# Patient Record
Sex: Male | Born: 1980 | Race: White | Hispanic: No | Marital: Single
Health system: Southern US, Community
[De-identification: ages and names within clinical notes are randomized; demographics above are authoritative.]

---

## 2015-03-06 ENCOUNTER — Emergency Department
Admit: 2015-03-06 | Disposition: A | Discharge status: Home / Self Care | Payer: Self-pay | Admitting: Emergency Medicine

## 2015-03-06 LAB — COMPREHENSIVE METABOLIC PANEL
ALT: 27 U/L
AST: 30 U/L
Albumin: 4.5 g/dL
Alkaline Phosphatase: 56 U/L
Anion Gap: 7 (ref 7–16)
BILIRUBIN TOTAL: 0.4 mg/dL
BUN: 21 mg/dL — AB
CALCIUM: 9.3 mg/dL
CHLORIDE: 107 mmol/L
CO2: 26 mmol/L
Creatinine: 1.13 mg/dL
EGFR (African American): >60
EGFR (Non-African Amer.): >60
Glucose: 94 mg/dL
POTASSIUM: 4.1 mmol/L
Sodium: 140 mmol/L
Total Protein: 7.8 g/dL

## 2015-03-06 LAB — CBC
HCT: 41.6 % (ref 40.0–52.0)
HGB: 13.4 g/dL (ref 13.0–18.0)
MCH: 27.1 pg (ref 26.0–34.0)
MCHC: 32.1 g/dL (ref 32.0–36.0)
MCV: 84 fL (ref 80–100)
Platelet: 255 10*3/uL (ref 150–440)
RBC: 4.94 10*6/uL (ref 4.40–5.90)
RDW: 14.8 % — ABNORMAL HIGH (ref 11.5–14.5)
WBC: 8.1 10*3/uL (ref 3.8–10.6)

## 2015-03-06 LAB — ACETAMINOPHEN LEVEL: Acetaminophen: <10 ug/mL

## 2015-03-06 LAB — ETHANOL: Ethanol: <5 mg/dL

## 2015-03-06 LAB — SALICYLATE LEVEL: Salicylates, Serum: <4 mg/dL

## 2015-03-07 LAB — DRUG SCREEN, URINE
Amphetamines, Ur Screen: POSITIVE
BARBITURATES, UR SCREEN: NEGATIVE
Benzodiazepine, Ur Scrn: NEGATIVE
CANNABINOID 50 NG, UR ~~LOC~~: NEGATIVE
COCAINE METABOLITE, UR ~~LOC~~: POSITIVE
MDMA (Ecstasy)Ur Screen: NEGATIVE
Methadone, Ur Screen: NEGATIVE
Opiate, Ur Screen: NEGATIVE
Phencyclidine (PCP) Ur S: NEGATIVE
Tricyclic, Ur Screen: NEGATIVE

## 2015-03-07 LAB — URINALYSIS, COMPLETE
Bilirubin,UR: NEGATIVE
Blood: NEGATIVE
GLUCOSE, UR: NEGATIVE mg/dL (ref 0–75)
LEUKOCYTE ESTERASE: NEGATIVE
Nitrite: NEGATIVE
Ph: 7 (ref 4.5–8.0)
Protein: NEGATIVE
RBC,UR: <1 /HPF (ref 0–5)
Specific Gravity: 1.014 (ref 1.003–1.030)
Squamous Epithelial: NONE SEEN
WBC UR: 3 /HPF (ref 0–5)

## 2015-04-02 NOTE — Consult Note (Addendum)
PATIENT NAME:  Jared Kramer, Jared Kramer DATE OF BIRTH:  14-Aug-1981  DATE OF CONSULTATION:  03/07/2015  REFERRING PHYSICIAN:   CONSULTING PHYSICIAN:  Audery AmelJohn T. Clapacs, MD  IDENTIFYING INFORMATION AND CHIEF COMPLAINT: This is a 34 year old man who comes to the Emergency Room with a chief complaint "I need help with detox."   HISTORY OF PRESENT ILLNESS: Information from the patient and the chart. The patient presented to the Emergency Room yesterday with a recent laceration to his right forearm and statements that he needed help with detoxification. He said that he has been using cocaine, benzodiazepines, and opiates heavily and has been doing so on and off for years. He cannot remember the last time he was sober. His last cocaine use was a couple of days ago, but prior to that he had been using daily for weeks. Benzodiazepines are about 3-4 bars of Xanax a day last taken 2 days ago, and opiates are whatever he can get his hands on including Opanas, Percocets, OxyContin, and heroin, last taken by his estimate 4-5 days ago. Also drinks regularly, cannot estimate the total amount, says he has not had any in 7 days. He wants to get detoxed because his girlfriend has thrown him out of the house and he lost his job about a week ago because of his substance abuse. His mood has been frustrated. His sleep has been a little erratic. Appetite normal. The patient denies any suicidal ideation. He cut his right forearm intentionally after being turned away [facility]. Based on his report it sounds like he had gone there and then walked out against medical advice and then tried to re-present [facility]. When they sent him back to the detoxification facility he was refused admission. At that point he cut his arm and called mobile crisis. He already went to [facility] and got the arm stapled and they did not think it  required hospitalization.   PAST PSYCHIATRIC HISTORY: The patient says he has been treated for depression just recently when they started him on lithium and trazodone and Zoloft at [facility] in [place]. Has never been in a psychiatric hospital. Denies any history of suicide attempts.   SUBSTANCE ABUSE HISTORY: He said he has had a drug and alcohol problem "my whole life." He has been detoxed on his own before. Has been able to have greater than a year of sobriety in the past before. It sounds like he may have gone to [facility] in the past as well. Has not been involved in any treatment program however in a couple of years at least. No history of seizures or DTs with alcohol withdrawal.   PAST MEDICAL HISTORY: Laceration to the right forearm which has been stapled shut. No other ongoing medical problems.   SOCIAL HISTORY: The patient had been married and then he and his wife got divorced and then they got back together again. Then she threw him out of the house again and so now it is unclear where he is going to stay. He had been working. 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 Has no children.   FAMILY HISTORY: Denies any family history of mental illness or substance abuse.   CURRENT MEDICATIONS: The patient says that he was started on lithium and Zoloft and that he normally takes a high blood pressure medicine but he does not know any  of them and we do not have any documentation of it in the system.   REVIEW OF SYSTEMS: Feeling achy and tired. Mildly jittery. A little sick to his stomach, but no major nausea. No vomiting. Denies suicidal or homicidal ideation. Denies hallucinations. Has sore in his right forearm as would be expected. No other physical complaints.   MENTAL STATUS EXAMINATION: Reasonably well groomed man who looks his stated age. Passively cooperative with the interview. Eye contact only occasional. Psychomotor activity sluggish. Speech  decreased in total amount, but easy to understand. Affect euthymic. Mood stated as all right. Thoughts are lucid without loosening of associations or delusions. Denies auditory or visual hallucinations. Denies any suicidal or homicidal ideation. The patient is alert and oriented x 4. Can repeat 3 words immediately, remembers only 1 out of 3 at 3 minutes. Judgment and insight impaired particularly by substance abuse. Normal fund of knowledge at baseline.   LABORATORY RESULTS: Alcohol level on presentation negative. Chemistry panel all normal except for a very slightly abnormal BUN of no significance. CBC is all normal. Urinalysis, 3 + bacteria, but minimal white cells. Drug screen finally came back and is positive for amphetamines, which is the only thing he did not tell me that he abused as well as cocaine. No sign of opiates or benzodiazepines. Salicylates negative.   VITAL SIGNS: Blood pressure 112/78, respirations 18, pulse 74, temperature 98.   ASSESSMENT: A 34 year old man who is reporting polysubstance abuse, although it sounds like it has been several days since he had most of it. He is dysphoric, but not suicidal and not psychotic. He cut his arm out of frustration without any apparent suicidal intent. No evidence of being psychotic right now. Does seem desperate to get substance abuse treatment.  TREATMENT PLAN: I suggested that we refer him to the alcohol and drug abuse treatment center. We can make a referral and see if we can possibly get him there within the next couple days. P.r.n. detoxification medicine meanwhile if needed. Monitor vital signs. No clear indication to restart those medicines unless his blood pressure goes up.   DIAGNOSIS PRINCIPAL AND PRIMARY:   AXIS I:  1.  Cocaine dependence.  2.  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  3.  Opiate abuse, severe.  4.  Alcohol abuse, moderate to severe.   AXIS II: Deferred.   AXIS III:  1.  Laceration to forearm, no  further treatment required.  2.  History of hypertension.     ____________________________ Audery Amel, MD jtc:bu D: 03/07/2015 13:20:18 ET T: 03/07/2015 13:34:25 ET JOB#: [id number]  cc: Audery Amel, MD, <Dictator> Audery Amel MD ELECTRONICALLY SIGNED 04/07/2015 10:06

## 2019-10-19 HISTORY — PX: FOREIGN BODY REMOVAL: SHX962

## 2020-03-02 ENCOUNTER — Emergency Department: Payer: Medicaid Other

## 2020-03-02 ENCOUNTER — Inpatient Hospital Stay
Admission: EM | Admit: 2020-03-02 | Discharge: 2020-03-05 | DRG: 917 | Disposition: A | Discharge status: Other Acute Inpatient Hospital | Payer: Medicaid Other | Attending: Pulmonary Disease | Admitting: Pulmonary Disease

## 2020-03-02 ENCOUNTER — Other Ambulatory Visit: Payer: Self-pay

## 2020-03-02 DIAGNOSIS — E162 Hypoglycemia, unspecified: Secondary | ICD-10-CM | POA: Diagnosis not present

## 2020-03-02 DIAGNOSIS — M795 Residual foreign body in soft tissue: Secondary | ICD-10-CM

## 2020-03-02 DIAGNOSIS — J690 Pneumonitis due to inhalation of food and vomit: Secondary | ICD-10-CM

## 2020-03-02 DIAGNOSIS — F209 Schizophrenia, unspecified: Secondary | ICD-10-CM | POA: Diagnosis present

## 2020-03-02 DIAGNOSIS — T50901A Poisoning by unspecified drugs, medicaments and biological substances, accidental (unintentional), initial encounter: Secondary | ICD-10-CM | POA: Diagnosis present

## 2020-03-02 DIAGNOSIS — F1110 Opioid abuse, uncomplicated: Secondary | ICD-10-CM | POA: Diagnosis present

## 2020-03-02 DIAGNOSIS — Z888 Allergy status to other drugs, medicaments and biological substances status: Secondary | ICD-10-CM

## 2020-03-02 DIAGNOSIS — T50911A Poisoning by multiple unspecified drugs, medicaments and biological substances, accidental (unintentional), initial encounter: Principal | ICD-10-CM | POA: Diagnosis present

## 2020-03-02 DIAGNOSIS — Z20822 Contact with and (suspected) exposure to covid-19: Secondary | ICD-10-CM | POA: Diagnosis present

## 2020-03-02 DIAGNOSIS — Z4659 Encounter for fitting and adjustment of other gastrointestinal appliance and device: Secondary | ICD-10-CM

## 2020-03-02 DIAGNOSIS — Z452 Encounter for adjustment and management of vascular access device: Secondary | ICD-10-CM

## 2020-03-02 DIAGNOSIS — Z7151 Drug abuse counseling and surveillance of drug abuser: Secondary | ICD-10-CM

## 2020-03-02 DIAGNOSIS — F6089 Other specific personality disorders: Secondary | ICD-10-CM | POA: Diagnosis present

## 2020-03-02 DIAGNOSIS — Z7289 Other problems related to lifestyle: Secondary | ICD-10-CM

## 2020-03-02 DIAGNOSIS — N1330 Unspecified hydronephrosis: Secondary | ICD-10-CM | POA: Diagnosis present

## 2020-03-02 DIAGNOSIS — Z915 Personal history of self-harm: Secondary | ICD-10-CM

## 2020-03-02 DIAGNOSIS — R339 Retention of urine, unspecified: Secondary | ICD-10-CM

## 2020-03-02 DIAGNOSIS — J9600 Acute respiratory failure, unspecified whether with hypoxia or hypercapnia: Secondary | ICD-10-CM

## 2020-03-02 DIAGNOSIS — F319 Bipolar disorder, unspecified: Secondary | ICD-10-CM | POA: Diagnosis present

## 2020-03-02 DIAGNOSIS — T50904A Poisoning by unspecified drugs, medicaments and biological substances, undetermined, initial encounter: Secondary | ICD-10-CM

## 2020-03-02 DIAGNOSIS — N3289 Other specified disorders of bladder: Secondary | ICD-10-CM | POA: Diagnosis present

## 2020-03-02 DIAGNOSIS — F1320 Sedative, hypnotic or anxiolytic dependence, uncomplicated: Secondary | ICD-10-CM | POA: Diagnosis present

## 2020-03-02 DIAGNOSIS — G92 Toxic encephalopathy: Secondary | ICD-10-CM | POA: Diagnosis present

## 2020-03-02 DIAGNOSIS — J9601 Acute respiratory failure with hypoxia: Secondary | ICD-10-CM | POA: Diagnosis present

## 2020-03-02 DIAGNOSIS — R569 Unspecified convulsions: Secondary | ICD-10-CM

## 2020-03-02 DIAGNOSIS — Z886 Allergy status to analgesic agent status: Secondary | ICD-10-CM

## 2020-03-02 DIAGNOSIS — B1920 Unspecified viral hepatitis C without hepatic coma: Secondary | ICD-10-CM | POA: Diagnosis present

## 2020-03-02 DIAGNOSIS — R0902 Hypoxemia: Secondary | ICD-10-CM

## 2020-03-02 DIAGNOSIS — I10 Essential (primary) hypertension: Secondary | ICD-10-CM | POA: Diagnosis present

## 2020-03-02 DIAGNOSIS — Z881 Allergy status to other antibiotic agents status: Secondary | ICD-10-CM

## 2020-03-02 DIAGNOSIS — G40409 Other generalized epilepsy and epileptic syndromes, not intractable, without status epilepticus: Secondary | ICD-10-CM | POA: Diagnosis not present

## 2020-03-02 LAB — URINALYSIS, COMPLETE (UACMP) WITH MICROSCOPIC
Bilirubin Urine: NEGATIVE
Glucose, UA: >=500 mg/dL — AB
Hgb urine dipstick: NEGATIVE
Ketones, ur: NEGATIVE mg/dL
Leukocytes,Ua: NEGATIVE
Nitrite: NEGATIVE
Protein, ur: NEGATIVE mg/dL
Specific Gravity, Urine: 1.005 (ref 1.005–1.030)
Squamous Epithelial / HPF: NONE SEEN (ref 0–5)
pH: 5 (ref 5.0–8.0)

## 2020-03-02 LAB — CBC WITH DIFFERENTIAL/PLATELET
Abs Immature Granulocytes: 0.02 10*3/uL (ref 0.00–0.07)
Basophils Absolute: 0 10*3/uL (ref 0.0–0.1)
Basophils Relative: 1 %
Eosinophils Absolute: 0.1 10*3/uL (ref 0.0–0.5)
Eosinophils Relative: 1 %
HCT: 44.9 % (ref 39.0–52.0)
Hemoglobin: 14.3 g/dL (ref 13.0–17.0)
Immature Granulocytes: 0 %
Lymphocytes Relative: 18 %
Lymphs Abs: 1.5 10*3/uL (ref 0.7–4.0)
MCH: 27.7 pg (ref 26.0–34.0)
MCHC: 31.8 g/dL (ref 30.0–36.0)
MCV: 87 fL (ref 80.0–100.0)
Monocytes Absolute: 0.6 10*3/uL (ref 0.1–1.0)
Monocytes Relative: 7 %
Neutro Abs: 6.1 10*3/uL (ref 1.7–7.7)
Neutrophils Relative %: 73 %
Platelets: 206 10*3/uL (ref 150–400)
RBC: 5.16 MIL/uL (ref 4.22–5.81)
RDW: 14.4 % (ref 11.5–15.5)
WBC: 8.4 10*3/uL (ref 4.0–10.5)
nRBC: 0 % (ref 0.0–0.2)

## 2020-03-02 LAB — BLOOD GAS, VENOUS
Acid-base deficit: 1.1 mmol/L (ref 0.0–2.0)
Bicarbonate: 24.3 mmol/L (ref 20.0–28.0)
O2 Saturation: 84.5 %
Patient temperature: 37
pCO2, Ven: 42 mmHg — ABNORMAL LOW (ref 44.0–60.0)
pH, Ven: 7.37 (ref 7.250–7.430)
pO2, Ven: 51 mmHg — ABNORMAL HIGH (ref 32.0–45.0)

## 2020-03-02 LAB — BASIC METABOLIC PANEL
Anion gap: 11 (ref 5–15)
BUN: 13 mg/dL (ref 6–20)
CO2: 21 mmol/L — ABNORMAL LOW (ref 22–32)
Calcium: 9.2 mg/dL (ref 8.9–10.3)
Chloride: 105 mmol/L (ref 98–111)
Creatinine, Ser: 1.02 mg/dL (ref 0.61–1.24)
GFR calc Af Amer: >60 mL/min (ref >60)
GFR calc non Af Amer: >60 mL/min (ref >60)
Glucose, Bld: 104 mg/dL — ABNORMAL HIGH (ref 70–99)
Potassium: 3.9 mmol/L (ref 3.5–5.1)
Sodium: 137 mmol/L (ref 135–145)

## 2020-03-02 LAB — URINE DRUG SCREEN, QUALITATIVE (ARMC ONLY)
Amphetamines, Ur Screen: NOT DETECTED
Barbiturates, Ur Screen: NOT DETECTED
Benzodiazepine, Ur Scrn: NOT DETECTED
Cannabinoid 50 Ng, Ur ~~LOC~~: NOT DETECTED
Cocaine Metabolite,Ur ~~LOC~~: POSITIVE — AB
MDMA (Ecstasy)Ur Screen: NOT DETECTED
Methadone Scn, Ur: NOT DETECTED
Opiate, Ur Screen: NOT DETECTED
Phencyclidine (PCP) Ur S: NOT DETECTED
Tricyclic, Ur Screen: POSITIVE — AB

## 2020-03-02 LAB — TROPONIN I (HIGH SENSITIVITY): Troponin I (High Sensitivity): 15 ng/L (ref <18)

## 2020-03-02 MED ORDER — SUCCINYLCHOLINE CHLORIDE 20 MG/ML IJ SOLN
INTRAMUSCULAR | Status: AC | PRN
  Administered 2020-03-02: 100 mg via INTRAVENOUS

## 2020-03-02 MED ORDER — PROPOFOL 1000 MG/100ML IV EMUL
INTRAVENOUS | Status: AC
  Administered 2020-03-02: 5 ug/kg/min via INTRAVENOUS
  Filled 2020-03-02: qty 100

## 2020-03-02 MED ORDER — NALOXONE HCL 2 MG/2ML IJ SOSY
2.0000 mg | PREFILLED_SYRINGE | Freq: Once | INTRAMUSCULAR | Status: AC
  Administered 2020-03-02: 2 mg via INTRAVENOUS

## 2020-03-02 MED ORDER — ETOMIDATE 2 MG/ML IV SOLN
INTRAVENOUS | Status: AC | PRN
  Administered 2020-03-02: 20 mg via INTRAVENOUS

## 2020-03-02 MED ORDER — SODIUM CHLORIDE 0.9 % IV SOLN: Freq: Once | INTRAVENOUS | Status: AC

## 2020-03-02 MED ORDER — PROPOFOL 1000 MG/100ML IV EMUL
5.0000 ug/kg/min | INTRAVENOUS | Status: DC
  Administered 2020-03-03: 30 ug/kg/min via INTRAVENOUS
  Administered 2020-03-03: 25 ug/kg/min via INTRAVENOUS
  Administered 2020-03-03: 30 ug/kg/min via INTRAVENOUS
  Administered 2020-03-03 – 2020-03-04 (×3): 25 ug/kg/min via INTRAVENOUS
  Administered 2020-03-04: 10 ug/kg/min via INTRAVENOUS
  Administered 2020-03-05: 30 ug/kg/min via INTRAVENOUS
  Administered 2020-03-05 (×4): 50 ug/kg/min via INTRAVENOUS
  Filled 2020-03-02 (×13): qty 100

## 2020-03-02 MED ORDER — FLUMAZENIL 0.5 MG/5ML IV SOLN
0.2000 mg | Freq: Once | INTRAVENOUS | Status: AC
  Administered 2020-03-02: 0.2 mg via INTRAVENOUS
  Filled 2020-03-02: qty 5

## 2020-03-02 NOTE — ED Provider Notes (Addendum)
[facility] Emergency Department Provider Note       Time seen: ----------------------------------------- 10:52 PM on 03/02/2020 -----------------------------------------  Level V caveat: History/ROS limited by altered mental status I have reviewed the triage vital signs and the nursing notes.  HISTORY   Chief Complaint Drug Overdose    HPI Jared Kramer is a 39 y.o. male with no known past medical history who presents to the ED for possible overdose.  He was found unresponsive except for painful stimuli in a gas station bathroom.  There was an empty pill bottle next to him.  History reviewed. No pertinent past medical history.  There are no problems to display for this patient.   History reviewed. No pertinent surgical history.  Allergies Patient has no allergy information on record.  Social History Social History   Tobacco Use  . Smoking status: Not on file  Substance Use Topics  . Alcohol use: Yes  . Drug use: Yes   Review of Systems Unknown, patient is responsive to pain  All systems negative/normal/unremarkable except as stated in the HPI  ____________________________________________   PHYSICAL EXAM:  VITAL SIGNS: ED Triage Vitals  Enc Vitals Group     BP --      Pulse Rate 03/02/20 2245 91     Resp 03/02/20 2245 12     Temp --      Temp src --      SpO2 03/02/20 2245 100 %     Weight 03/02/20 2239 250 lb (113.4 kg)     Height 03/02/20 2239 6' (1.829 m)     Head Circumference --      Peak Flow --      Pain Score 03/02/20 2239 Asleep     Pain Loc --      Pain Edu? --      Excl. in GC? --     Constitutional: Patient is not alert and is somewhat combative, no obvious distress Eyes: Disconjugate gaze, conjunctival injection ENT      Head: Normocephalic and atraumatic.      Nose: No congestion/rhinnorhea.      Mouth/Throat: Mucous membranes are moist.      Neck: No stridor. Cardiovascular: Normal rate, regular  rhythm. No murmurs, rubs, or gallops. Respiratory: Normal respiratory effort without tachypnea nor retractions. Breath sounds are clear and equal bilaterally. No wheezes/rales/rhonchi. Gastrointestinal: Soft, normal bowel sounds Musculoskeletal:  No lower extremity tenderness nor edema. Neurologic: GCS is 9, opens his eyes to pain, make incomprehensible sounds and localizes to pain Skin:  Skin is warm, dry and intact. No rash noted. Psychiatric: Mood and affect are normal. Speech and behavior are normal.  ____________________________________________  EKG: Interpreted by me.  Sinus rhythm with rate of 97 bpm, borderline right axis deviation, nonspecific ST segment changes, possible pericarditis  ____________________________________________  ED COURSE:  As part of my medical decision making, I reviewed the following data within the electronic MEDICAL RECORD NUMBER History obtained from family if available, nursing notes, old chart and ekg, as well as notes from prior ED visits. Patient presented for altered mental status and possible overdose, we will assess with labs and imaging as indicated at this time.   Procedure Name: Intubation Date/Time: 03/02/2020 11:29 PM Performed by: Emily Filbert, MD Pre-anesthesia Checklist: Patient identified, Patient being monitored, Emergency Drugs available, Timeout performed and Suction available Oxygen Delivery Method: Non-rebreather mask Preoxygenation: Pre-oxygenation with 100% oxygen Induction Type: Rapid sequence Ventilation: Mask ventilation without difficulty Laryngoscope Size: 3 Grade  View: Grade II Tube size: 8.0 mm Number of attempts: 1 Airway Equipment and Method: Video-laryngoscopy Placement Confirmation: ETT inserted through vocal cords under direct vision,  CO2 detector and Breath sounds checked- equal and bilateral Secured at: 24 cm Tube secured with: ETT holder Dental Injury: Teeth and Oropharynx as per pre-operative assessment   Difficulty Due To: Difficulty was unanticipated    OG placement  Date/Time: 03/02/2020 11:30 PM Performed by: Earleen Newport, MD Authorized by: Earleen Newport, MD  Consent: The procedure was performed in an emergent situation.  Sedation: Patient sedated: yes      Jared Kramer was evaluated in Emergency Department on 03/02/2020 for the symptoms described in the history of present illness. He was evaluated in the context of the global COVID-19 pandemic, which necessitated consideration that the patient might be at risk for infection with the SARS-CoV-2 virus that causes COVID-19. Institutional protocols and algorithms that pertain to the evaluation of patients at risk for COVID-19 are in a state of rapid change based on information released by regulatory bodies including the CDC and federal and state organizations. These policies and algorithms were followed during the patient's care in the ED.  ____________________________________________   LABS (pertinent positives/negatives)  Labs Reviewed  BASIC METABOLIC PANEL - Abnormal; Notable for the following components:      Result Value   CO2 21 (*)    Glucose, Bld 104 (*)    All other components within normal limits  BLOOD GAS, VENOUS - Abnormal; Notable for the following components:   pCO2, Ven 42 (*)    pO2, Ven 51.0 (*)    All other components within normal limits  CBC WITH DIFFERENTIAL/PLATELET  ETHANOL  URINE DRUG SCREEN, QUALITATIVE ([facility] ONLY)  URINALYSIS, COMPLETE ([facility]) WITH MICROSCOPIC  TROPONIN I (HIGH SENSITIVITY)   CRITICAL CARE Performed by: Laurence Aly   Total critical care time: 30 minutes  Critical care time was exclusive of separately billable procedures and treating other patients.  Critical care was necessary to treat or prevent imminent or life-threatening deterioration.  Critical care was time spent personally by me on the following activities: development of treatment plan with  patient and/or surrogate as well as nursing, discussions with consultants, evaluation of patient's response to treatment, examination of patient, obtaining history from patient or surrogate, ordering and performing treatments and interventions, ordering and review of laboratory studies, ordering and review of radiographic studies, pulse oximetry and re-evaluation of patient's condition.  RADIOLOGY Images were viewed by me  Chest x-ray and abdomen 1 view are pending at this time ET tube is in good position, OG tube is in the stomach CT head is pending at this time ____________________________________________   DIFFERENTIAL DIAGNOSIS   Overdose, intoxication, intracranial hemorrhage, CVA, sepsis  FINAL ASSESSMENT AND PLAN  Altered mental status, likely drug overdose   Plan: The patient had presented for altered mental status. Patient's labs so far have been unrevealing. Patient's imaging are still pending.  Patient arrived altered with a Glascow coma score around 8 but appear to be having periods of apnea.  He was intubated for airway protection and OG tube was placed also by me.  He will also be placed under involuntary commitment.  I have discussed with the ICU provider for admission.   Laurence Aly, MD    Note: This note was generated in part or whole with voice recognition software. Voice recognition is usually quite accurate but there are transcription errors that can and very often  do occur. I apologize for any typographical errors that were not detected and corrected.     Emily Filbert, MD 03/02/20 [id number]    Emily Filbert, MD 03/02/20 [phone]

## 2020-03-02 NOTE — ED Triage Notes (Signed)
Pt to ED via EMS. Pt arrives with possible overdose, pt was found repsonsive to painful stimuli only in gas station bathroom. There was an empty pill bottle found next to pt, white powder substance in bottle as well as snorting device and piece of glass that appears to belong to meth pipe.

## 2020-03-03 ENCOUNTER — Inpatient Hospital Stay (HOSPITAL_COMMUNITY)
Admit: 2020-03-03 | Discharge: 2020-03-03 | Disposition: A | Discharge status: Home / Self Care | Payer: Medicaid Other | Attending: Pulmonary Disease | Admitting: Pulmonary Disease

## 2020-03-03 ENCOUNTER — Emergency Department: Payer: Medicaid Other

## 2020-03-03 DIAGNOSIS — G92 Toxic encephalopathy: Secondary | ICD-10-CM | POA: Diagnosis present

## 2020-03-03 DIAGNOSIS — J9601 Acute respiratory failure with hypoxia: Secondary | ICD-10-CM

## 2020-03-03 DIAGNOSIS — T50904A Poisoning by unspecified drugs, medicaments and biological substances, undetermined, initial encounter: Secondary | ICD-10-CM

## 2020-03-03 DIAGNOSIS — Z881 Allergy status to other antibiotic agents status: Secondary | ICD-10-CM | POA: Diagnosis not present

## 2020-03-03 DIAGNOSIS — I10 Essential (primary) hypertension: Secondary | ICD-10-CM | POA: Diagnosis present

## 2020-03-03 DIAGNOSIS — J9602 Acute respiratory failure with hypercapnia: Secondary | ICD-10-CM | POA: Diagnosis not present

## 2020-03-03 DIAGNOSIS — F6089 Other specific personality disorders: Secondary | ICD-10-CM | POA: Diagnosis present

## 2020-03-03 DIAGNOSIS — F1320 Sedative, hypnotic or anxiolytic dependence, uncomplicated: Secondary | ICD-10-CM | POA: Diagnosis present

## 2020-03-03 DIAGNOSIS — R9431 Abnormal electrocardiogram [ECG] [EKG]: Secondary | ICD-10-CM | POA: Diagnosis not present

## 2020-03-03 DIAGNOSIS — T50904S Poisoning by unspecified drugs, medicaments and biological substances, undetermined, sequela: Secondary | ICD-10-CM | POA: Diagnosis not present

## 2020-03-03 DIAGNOSIS — J690 Pneumonitis due to inhalation of food and vomit: Secondary | ICD-10-CM | POA: Diagnosis present

## 2020-03-03 DIAGNOSIS — E162 Hypoglycemia, unspecified: Secondary | ICD-10-CM | POA: Diagnosis not present

## 2020-03-03 DIAGNOSIS — B1920 Unspecified viral hepatitis C without hepatic coma: Secondary | ICD-10-CM | POA: Diagnosis present

## 2020-03-03 DIAGNOSIS — Z7289 Other problems related to lifestyle: Secondary | ICD-10-CM | POA: Diagnosis not present

## 2020-03-03 DIAGNOSIS — T50901A Poisoning by unspecified drugs, medicaments and biological substances, accidental (unintentional), initial encounter: Secondary | ICD-10-CM | POA: Diagnosis present

## 2020-03-03 DIAGNOSIS — N1330 Unspecified hydronephrosis: Secondary | ICD-10-CM | POA: Diagnosis present

## 2020-03-03 DIAGNOSIS — T50911A Poisoning by multiple unspecified drugs, medicaments and biological substances, accidental (unintentional), initial encounter: Secondary | ICD-10-CM | POA: Diagnosis present

## 2020-03-03 DIAGNOSIS — F319 Bipolar disorder, unspecified: Secondary | ICD-10-CM | POA: Diagnosis present

## 2020-03-03 DIAGNOSIS — Z915 Personal history of self-harm: Secondary | ICD-10-CM | POA: Diagnosis not present

## 2020-03-03 DIAGNOSIS — Z7151 Drug abuse counseling and surveillance of drug abuser: Secondary | ICD-10-CM | POA: Diagnosis not present

## 2020-03-03 DIAGNOSIS — J9600 Acute respiratory failure, unspecified whether with hypoxia or hypercapnia: Secondary | ICD-10-CM | POA: Diagnosis not present

## 2020-03-03 DIAGNOSIS — T50904D Poisoning by unspecified drugs, medicaments and biological substances, undetermined, subsequent encounter: Secondary | ICD-10-CM | POA: Diagnosis not present

## 2020-03-03 DIAGNOSIS — Z888 Allergy status to other drugs, medicaments and biological substances status: Secondary | ICD-10-CM | POA: Diagnosis not present

## 2020-03-03 DIAGNOSIS — Z886 Allergy status to analgesic agent status: Secondary | ICD-10-CM | POA: Diagnosis not present

## 2020-03-03 DIAGNOSIS — N3289 Other specified disorders of bladder: Secondary | ICD-10-CM | POA: Diagnosis present

## 2020-03-03 DIAGNOSIS — G40409 Other generalized epilepsy and epileptic syndromes, not intractable, without status epilepticus: Secondary | ICD-10-CM | POA: Diagnosis not present

## 2020-03-03 DIAGNOSIS — F209 Schizophrenia, unspecified: Secondary | ICD-10-CM | POA: Diagnosis present

## 2020-03-03 DIAGNOSIS — R569 Unspecified convulsions: Secondary | ICD-10-CM | POA: Diagnosis not present

## 2020-03-03 DIAGNOSIS — Z20822 Contact with and (suspected) exposure to covid-19: Secondary | ICD-10-CM | POA: Diagnosis present

## 2020-03-03 DIAGNOSIS — F1110 Opioid abuse, uncomplicated: Secondary | ICD-10-CM | POA: Diagnosis present

## 2020-03-03 LAB — BASIC METABOLIC PANEL
Anion gap: 7 (ref 5–15)
Anion gap: 4 — ABNORMAL LOW (ref 5–15)
BUN: 14 mg/dL (ref 6–20)
BUN: 14 mg/dL (ref 6–20)
CO2: 25 mmol/L (ref 22–32)
CO2: 27 mmol/L (ref 22–32)
Calcium: 8.6 mg/dL — ABNORMAL LOW (ref 8.9–10.3)
Calcium: 8.6 mg/dL — ABNORMAL LOW (ref 8.9–10.3)
Chloride: 111 mmol/L (ref 98–111)
Chloride: 112 mmol/L — ABNORMAL HIGH (ref 98–111)
Creatinine, Ser: 1.1 mg/dL (ref 0.61–1.24)
Creatinine, Ser: 1.08 mg/dL (ref 0.61–1.24)
GFR calc Af Amer: >60 mL/min (ref >60)
GFR calc Af Amer: >60 mL/min (ref >60)
GFR calc non Af Amer: >60 mL/min (ref >60)
GFR calc non Af Amer: >60 mL/min (ref >60)
Glucose, Bld: 91 mg/dL (ref 70–99)
Glucose, Bld: 88 mg/dL (ref 70–99)
Potassium: 4 mmol/L (ref 3.5–5.1)
Potassium: 3.9 mmol/L (ref 3.5–5.1)
Sodium: 143 mmol/L (ref 135–145)
Sodium: 143 mmol/L (ref 135–145)

## 2020-03-03 LAB — TROPONIN I (HIGH SENSITIVITY)
Troponin I (High Sensitivity): 18 ng/L — ABNORMAL HIGH (ref <18)
Troponin I (High Sensitivity): 16 ng/L (ref <18)

## 2020-03-03 LAB — BLOOD GAS, ARTERIAL
Acid-base deficit: 1.3 mmol/L (ref 0.0–2.0)
Bicarbonate: 23.7 mmol/L (ref 20.0–28.0)
FIO2: 0.35
MECHVT: 450 mL
Mechanical Rate: 20
O2 Saturation: 99.3 %
PEEP: 5 cmH2O
Patient temperature: 37
RATE: 20 resp/min
pCO2 arterial: 40 mmHg (ref 32.0–48.0)
pH, Arterial: 7.38 (ref 7.350–7.450)
pO2, Arterial: 155 mmHg — ABNORMAL HIGH (ref 83.0–108.0)

## 2020-03-03 LAB — ECHOCARDIOGRAM COMPLETE
Height: 70 in
Weight: 3030 oz

## 2020-03-03 LAB — SARS CORONAVIRUS 2 (TAT 6-24 HRS): SARS Coronavirus 2: NEGATIVE

## 2020-03-03 LAB — GLUCOSE, CAPILLARY
Glucose-Capillary: 64 mg/dL — ABNORMAL LOW (ref 70–99)
Glucose-Capillary: 82 mg/dL (ref 70–99)
Glucose-Capillary: 86 mg/dL (ref 70–99)
Glucose-Capillary: 88 mg/dL (ref 70–99)
Glucose-Capillary: 93 mg/dL (ref 70–99)
Glucose-Capillary: 97 mg/dL (ref 70–99)
Glucose-Capillary: 97 mg/dL (ref 70–99)

## 2020-03-03 LAB — ACETAMINOPHEN LEVEL: Acetaminophen (Tylenol), Serum: <10 ug/mL — ABNORMAL LOW (ref 10–30)

## 2020-03-03 LAB — CBC
HCT: 41 % (ref 39.0–52.0)
Hemoglobin: 13 g/dL (ref 13.0–17.0)
MCH: 27.9 pg (ref 26.0–34.0)
MCHC: 31.7 g/dL (ref 30.0–36.0)
MCV: 88 fL (ref 80.0–100.0)
Platelets: UNDETERMINED 10*3/uL (ref 150–400)
RBC: 4.66 MIL/uL (ref 4.22–5.81)
RDW: 14.9 % (ref 11.5–15.5)
WBC: 5.2 10*3/uL (ref 4.0–10.5)
nRBC: 0 % (ref 0.0–0.2)

## 2020-03-03 LAB — RESPIRATORY PANEL BY RT PCR (FLU A&B, COVID)
Influenza A by PCR: NEGATIVE
Influenza B by PCR: NEGATIVE
SARS Coronavirus 2 by RT PCR: NEGATIVE

## 2020-03-03 LAB — ETHANOL: Alcohol, Ethyl (B): <10 mg/dL (ref <10)

## 2020-03-03 LAB — HIV ANTIBODY (ROUTINE TESTING W REFLEX): HIV Screen 4th Generation wRfx: NONREACTIVE

## 2020-03-03 LAB — SALICYLATE LEVEL: Salicylate Lvl: <7 mg/dL — ABNORMAL LOW (ref 7.0–30.0)

## 2020-03-03 LAB — MRSA PCR SCREENING: MRSA by PCR: NEGATIVE

## 2020-03-03 LAB — CK: Total CK: 56 U/L (ref 49–397)

## 2020-03-03 MED ORDER — PERFLUTREN LIPID MICROSPHERE
1.0000 mL | INTRAVENOUS | Status: AC | PRN
  Administered 2020-03-03: 15:00:00 2 mL via INTRAVENOUS
  Filled 2020-03-03: qty 10

## 2020-03-03 MED ORDER — FENTANYL CITRATE (PF) 100 MCG/2ML IJ SOLN
50.0000 ug | Freq: Once | INTRAMUSCULAR | Status: AC
  Administered 2020-03-03: 50 ug via INTRAVENOUS
  Filled 2020-03-03: qty 2

## 2020-03-03 MED ORDER — CHLORHEXIDINE GLUCONATE CLOTH 2 % EX PADS
6.0000 | MEDICATED_PAD | Freq: Every day | CUTANEOUS | Status: DC
  Administered 2020-03-03 – 2020-03-05 (×3): 6 via TOPICAL
  Filled 2020-03-03: qty 6

## 2020-03-03 MED ORDER — ACETAMINOPHEN 325 MG PO TABS
650.0000 mg | ORAL_TABLET | ORAL | Status: DC | PRN
  Administered 2020-03-05: 650 mg via ORAL
  Filled 2020-03-03: qty 2

## 2020-03-03 MED ORDER — DEXTROSE-NACL 5-0.9 % IV SOLN
INTRAVENOUS | Status: DC
  Administered 2020-03-03: 125 mL/h via INTRAVENOUS

## 2020-03-03 MED ORDER — DOPAMINE-DEXTROSE 3.2-5 MG/ML-% IV SOLN
0.0000 ug/kg/min | INTRAVENOUS | Status: DC
  Administered 2020-03-03: 5 ug/kg/min via INTRAVENOUS

## 2020-03-03 MED ORDER — FENTANYL 2500MCG IN NS 250ML (10MCG/ML) PREMIX INFUSION
50.0000 ug/h | INTRAVENOUS | Status: DC
  Administered 2020-03-03: 50 ug/h via INTRAVENOUS
  Administered 2020-03-04: 100 ug/h via INTRAVENOUS
  Administered 2020-03-05: 150 ug/h via INTRAVENOUS
  Filled 2020-03-03 (×3): qty 250

## 2020-03-03 MED ORDER — MIDAZOLAM HCL 2 MG/2ML IJ SOLN
2.0000 mg | INTRAMUSCULAR | Status: AC | PRN
  Administered 2020-03-04 – 2020-03-05 (×3): 2 mg via INTRAVENOUS
  Filled 2020-03-03 (×3): qty 2

## 2020-03-03 MED ORDER — ONDANSETRON HCL 4 MG/2ML IJ SOLN: 4.0000 mg | Freq: Four times a day (QID) | INTRAMUSCULAR | Status: DC | PRN

## 2020-03-03 MED ORDER — PANTOPRAZOLE SODIUM 40 MG IV SOLR
40.0000 mg | Freq: Every day | INTRAVENOUS | Status: DC
  Administered 2020-03-03 – 2020-03-05 (×3): 40 mg via INTRAVENOUS
  Filled 2020-03-03 (×3): qty 40

## 2020-03-03 MED ORDER — LACTATED RINGERS IV SOLN: INTRAVENOUS | Status: DC

## 2020-03-03 MED ORDER — IPRATROPIUM-ALBUTEROL 0.5-2.5 (3) MG/3ML IN SOLN: 3.0000 mL | RESPIRATORY_TRACT | Status: DC | PRN

## 2020-03-03 MED ORDER — MIDAZOLAM HCL 2 MG/2ML IJ SOLN
2.0000 mg | INTRAMUSCULAR | Status: DC | PRN
  Administered 2020-03-05 (×2): 2 mg via INTRAVENOUS
  Filled 2020-03-03 (×3): qty 2

## 2020-03-03 MED ORDER — DEXTROSE 50 % IV SOLN
25.0000 mL | Freq: Once | INTRAVENOUS | Status: AC
  Administered 2020-03-03: 25 mL via INTRAVENOUS

## 2020-03-03 MED ORDER — ENOXAPARIN SODIUM 40 MG/0.4ML ~~LOC~~ SOLN
40.0000 mg | SUBCUTANEOUS | Status: DC
  Administered 2020-03-03 – 2020-03-05 (×3): 40 mg via SUBCUTANEOUS
  Filled 2020-03-03 (×3): qty 0.4

## 2020-03-03 MED ORDER — DEXTROSE IN LACTATED RINGERS 5 % IV SOLN
INTRAVENOUS | Status: DC
  Administered 2020-03-03: 125 mL/h via INTRAVENOUS

## 2020-03-03 MED ORDER — SODIUM CHLORIDE 0.9 % IV BOLUS: 1000.0000 mL | Freq: Once | INTRAVENOUS | Status: AC

## 2020-03-03 MED ORDER — FAMOTIDINE IN NACL 20-0.9 MG/50ML-% IV SOLN: 20.0000 mg | Freq: Two times a day (BID) | INTRAVENOUS | Status: DC

## 2020-03-03 MED ORDER — SODIUM CHLORIDE 0.9 % IV BOLUS
1000.0000 mL | Freq: Once | INTRAVENOUS | Status: AC
  Administered 2020-03-03: 1000 mL via INTRAVENOUS

## 2020-03-03 MED ORDER — DOPAMINE-DEXTROSE 3.2-5 MG/ML-% IV SOLN
INTRAVENOUS | Status: AC
  Filled 2020-03-03: qty 250

## 2020-03-03 MED ORDER — FENTANYL BOLUS VIA INFUSION
50.0000 ug | INTRAVENOUS | Status: DC | PRN
  Filled 2020-03-03: qty 50

## 2020-03-03 NOTE — Progress Notes (Signed)
*  PRELIMINARY RESULTS* Echocardiogram 2D Echocardiogram has been performed.  Neita Garnet Annie Saephan 03/03/2020, 2:48 PM

## 2020-03-03 NOTE — H&P (Signed)
Name: Jared Kramer MRN: [id number] DOB: 11/30/1981    ADMISSION DATE:  03/02/2020 CONSULTATION DATE:  03/02/2020  REFERRING MD : Dr. Jimmye Norman  CHIEF COMPLAINT: Drug overdose  BRIEF PATIENT DESCRIPTION:  39 year old male admitted with drug overdose (unsure if intentional vs. unintentional) requiring intubation in the ED for airway protection.  Urine drug screen is positive for cocaine and tricyclics.  SIGNIFICANT EVENTS  4/1: Intubated in ED for airway protection 4/2: Admitted to ICU  STUDIES:  4/1: Chest x-ray>>Endotracheal tube terminates 4 cm above the carina. Lungs are clear.  No pleural effusion or pneumothorax.The heart is normal in size.Old right posterior rib fracture deformities.Enteric tube courses into the stomach. 4/1: Abdominal 1 view x-ray>>Enteric tube terminates in the proximal gastric body. Linear radiopaque foreign body overlies the right upper abdomen 4/2: CT head without contrast>>No acute intracranial abnormality noted. Minimal air-fluid level within the left maxillary antrum.  CULTURES: SARS-CoV-2 PCR 4/1>> negative Influenza PCR 4/1>> negative  ANTIBIOTICS: N/A  HISTORY OF PRESENT ILLNESS:   Mr. Jared Kramer is a 39 year old male with no known past medical history who presents to [facility] ED on 03/02/2020 due to possible drug overdose.  He is currently intubated and sedated and no family present, therefore history is obtained from ED and nursing notes.  Per notes he was found unresponsive in a gas station bathroom with an empty pill bottle next to him.  Upon presentation to ED he was noted to be altered, and responsive to painful stimuli.  Vital signs stable. Initial blood work is unremarkable. Ethyl alcohol is less than 10, serum salicylates and acetaminophen are pending. EKG with NSR, borderline right axis deviation, nonspecific ST segment changes, and questionable pericarditis.  His GCS score was 8, but he was having periods of apnea.  He was subsequently intubated  for airway protection by the ED provider.  CT head is negative for any acute intracranial abnormality, chest x-ray is normal.  His SARS-CoV-2 PCR is negative.  Urinalysis is negative, urine drug screen is positive for cocaine and tricyclics.  He was placed on her involuntary commitment.  PCCM is asked to admit the patient to ICU for further work-up and treatment of drug overdose (unsure if unintentional versus intentional) requiring intubation for airway protection.  PAST MEDICAL HISTORY :   has no past medical history on file.  has no past surgical history on file. Prior to Admission medications   Not on File   Not on File  FAMILY HISTORY:  family history is not on file. SOCIAL HISTORY:  reports current alcohol use. He reports current drug use.   COVID-19 DISASTER DECLARATION:  FULL CONTACT PHYSICAL EXAMINATION WAS NOT POSSIBLE DUE TO TREATMENT OF COVID-19 AND  CONSERVATION OF PERSONAL PROTECTIVE EQUIPMENT, LIMITED EXAM FINDINGS INCLUDE-  Patient assessed or the symptoms described in the history of present illness.  In the context of the Global COVID-19 pandemic, which necessitated consideration that the patient might be at risk for infection with the SARS-CoV-2 virus that causes COVID-19, Institutional protocols and algorithms that pertain to the evaluation of patients at risk for COVID-19 are in a state of rapid change based on information released by regulatory bodies including the CDC and federal and state organizations. These policies and algorithms were followed during the patient's care while in hospital.  REVIEW OF SYSTEMS:   Unable to assess due to intubation and sedation  SUBJECTIVE:  Unable to assess due to intubation and sedation  VITAL SIGNS: Pulse Rate:  [78-94] 84 (04/02 0230) Resp:  [  12-22] 21 (04/02 0230) BP: (107-155)/(77-90) 107/77 (04/02 0230) SpO2:  [98 %-100 %] 100 % (04/02 0230) FiO2 (%):  [35 %] 35 % (04/01 2315) Weight:  [113.4 kg] 113.4 kg (04/01  2239)  PHYSICAL EXAMINATION: General:  Acutely ill appearing male, laying in bed, intubated and sedated, in NAD Neuro:  Sedated, withdraws from pain, pupils PERRLA HEENT:  Atraumatic, normocephalic, neck supple, no JVD Cardiovascular:  Regular rate & rhythm, s1s2, no M/R/G, 2+ pulses throughout Lungs:  Clear to auscultation bilaterally, even, vent assisted, compliant with vent Abdomen:  Soft, nontender, nondistended, no guarding or rebound tenderness, BS+ x4 Musculoskeletal:  Normal bulk and tone, no deformities, no edema Skin:  Warm and dry.  No obvious rashes, lesions, or ulcerations  Recent Labs  Lab 03/02/20 2254  NA 137  K 3.9  CL 105  CO2 21*  BUN 13  CREATININE 1.02  GLUCOSE 104*   Recent Labs  Lab 03/02/20 2254  HGB 14.3  HCT 44.9  WBC 8.4  PLT 206   DG Chest 1 View  Result Date: 03/02/2020 CLINICAL DATA:  Intubation EXAM: CHEST  1 VIEW COMPARISON:  12/20/2019 FINDINGS: Endotracheal tube terminates 4 cm above the carina. Lungs are clear.  No pleural effusion or pneumothorax. The heart is normal in size. Old right posterior rib fracture deformities. Enteric tube courses into the stomach. IMPRESSION: Endotracheal tube terminates 4 cm above the carina. No evidence of acute cardiopulmonary disease. Electronically Signed   By: Julian Hy M.D.   On: 03/02/2020 23:51   DG Abdomen 1 View  Result Date: 03/02/2020 CLINICAL DATA:  ET/OG tube placement EXAM: ABDOMEN - 1 VIEW COMPARISON:  12/29/2019 FINDINGS: Enteric tube terminates in the proximal gastric body. Linear radiopaque foreign body overlies the right upper abdomen. IMPRESSION: Enteric tube terminates in the proximal gastric body. Linear radiopaque foreign body overlies the right upper abdomen. Electronically Signed   By: Julian Hy M.D.   On: 03/02/2020 23:50   CT Head Wo Contrast  Result Date: 03/03/2020 CLINICAL DATA:  Possible overdose EXAM: CT HEAD WITHOUT CONTRAST TECHNIQUE: Contiguous axial images were  obtained from the base of the skull through the vertex without intravenous contrast. COMPARISON:  08/06/2019 FINDINGS: Brain: No evidence of acute infarction, hemorrhage, hydrocephalus, extra-axial collection or mass lesion/mass effect. Vascular: No hyperdense vessel or unexpected calcification. Skull: Normal. Negative for fracture or focal lesion. Sinuses/Orbits: Minimal air-fluid level is noted within the left maxillary antrum. Other: None IMPRESSION: No acute intracranial abnormality noted. Minimal air-fluid level within the left maxillary antrum. Electronically Signed   By: Inez Catalina M.D.   On: 03/03/2020 00:45    ASSESSMENT / PLAN:  Intubated for airway protection due to drug overdose -Full vent support -Wean FiO2 and PEEP as tolerated -Follow intermittent chest x-ray and ABG as needed -VAP protocol -Spontaneous breathing trials when respiratory parameters met and mental status permits  Drug overdose, unsure if intentional vs. Unintentional -Urine drug screen is positive for cocaine and tricyclics -Ethyl alcohol < 10 -Check serum Salicylates & Acetaminophen ~ both normal -Continuous cardiac monitoring -Follow serial EKG's q6h -Check troponin -2D echocardiogram pending -Discussed with poison control ~ continue supportive care and follow serial EKG's q6h -IV fluids -IVC -Will need psych consult once extubated  Acute metabolic encephalopathy secondary to drug overdose -CT head negative 4/1 -Maintain RASS goal 0 to -1 -Fentanyl and propofol drips to maintain RASS goal -Daily wake up assessment -Provide supportive care -Encourage substance abuse cessation  Best practices: Disposition: ICU Goals of care: Full code VTE prophylaxis: Subcu Lovenox Stress ulcer prophylaxis: IV Protonix Updates: No family at bedside for updates 03/03/2020 during NP rounds  Darel Hong, [facility] [place] Pager: [phone]  03/03/2020, 2:38  AM

## 2020-03-03 NOTE — Progress Notes (Addendum)
Initial Nutrition Assessment  DOCUMENTATION CODES:   Not applicable  INTERVENTION:   If tube feeds initiated, recommend:  Vital 1.2 @ 68ml/hr + Prostat 24ml QID via tube   Propofol: 20.4 ml/hr- provides 538kcal/day   Free water flushes 65ml q4 hours to maintain tube patency   Regimen provides 1946kcal/day, 123g/day protein and 824ml/day free water  Recommend Liquid MVI daily via tube   Pt likely at moderate refeed risk  NUTRITION DIAGNOSIS:   Inadequate oral intake related to inability to eat(pt sedated and ventilated) as evidenced by NPO status.  GOAL:   Patient will meet greater than or equal to 90% of their needs  MONITOR:   PO intake, Supplement acceptance, Labs, Weight trends, Skin, I & O's  REASON FOR ASSESSMENT:   Ventilator    ASSESSMENT:   39 year old male admitted with drug overdose (unsure if intentional vs. unintentional) requiring intubation in the ED for airway protection.  Urine drug screen is positive for cocaine and tricyclics.   Pt sedated and ventilated. OGT in place. There is no weight history in chart to determine if any recent significant weight loss.   Medications reviewed and include: lovenox, protoni, LRS w/ 5% dextrose @125ml /hr, dopamine, propofol    Labs reviewed:   Patient is currently intubated on ventilator support MV: 9.2 L/min Temp (24hrs), Avg:97.5 F (36.4 C), Min:97.4 F (36.3 C), Max:97.5 F (36.4 C)  Propofol: 20.4 ml/hr- provides 538kcal/day   MAP- >37mmHg  NUTRITION - FOCUSED PHYSICAL EXAM:    Most Recent Value  Orbital Region  No depletion  Upper Arm Region  No depletion  Thoracic and Lumbar Region  No depletion  Buccal Region  No depletion  Temple Region  No depletion  Clavicle Bone Region  No depletion  Clavicle and Acromion Bone Region  No depletion  Scapular Bone Region  No depletion  Dorsal Hand  No depletion  Patellar Region  No depletion  Anterior Thigh Region  No depletion  Posterior Calf Region   No depletion  Edema (RD Assessment)  None  Hair  Reviewed  Eyes  Reviewed  Mouth  Reviewed  Skin  Reviewed  Nails  Reviewed     Diet Order:   Diet Order            Diet NPO time specified  Diet effective now             EDUCATION NEEDS:   No education needs have been identified at this time  Skin:  Skin Assessment: Reviewed RN Assessment  Last BM:  pta  Height:   Ht Readings from Last 1 Encounters:  03/03/20 5\' 10"  (1.778 m)    Weight:   Wt Readings from Last 1 Encounters:  03/03/20 85.9 kg    Ideal Body Weight:  75.45 kg  BMI:  Body mass index is 27.17 kg/m.  Estimated Nutritional Needs:   Kcal:  1868kcal/day  Protein:  115-130g/day  Fluid:  >2.3L/day  MS, RD, LDN Please refer to [facility] for RD and/or RD on-call/weekend/after hours pager

## 2020-03-03 NOTE — Progress Notes (Addendum)
CSW consulted to obtain primary contact/POA information and Advance Directives. Patient is currently intubated. Per chart review, patient's Father has given him rides in the past. A Jared Kramer is listed as his POA from 2020, but neither of the numbers listed for Jared Kramer are in service. Unable to find other contact information in chart.  CSW called Person [employer] and was transferred to [employer]. Representative reported she will have an Officer call CSW with any information they have.  9:30- Call from patient's father Jared Kramer [phone]) who confirmed he is patient's Father/POA. He was informed that patient is in the ICU and that CSW will have medical staff call him with an update. He reported he will try to come to the hospital today.   CSW then received a call from patient's mother Jared Kramer [phone]) asking for an update. Informed her that patient is in ICU. She reported she is fine with updates being given to Jared Kramer and she will communicate with him for updates.   CSW updated MD and RN following this patient.  Jared Kramer, [place] [phone]

## 2020-03-03 NOTE — Progress Notes (Signed)
   03/03/20 1400  Clinical Encounter Type  Visited With Patient  Visit Type Initial  Referral From Chaplain  Consult/Referral To Chaplain  Spiritual Encounters  Spiritual Needs Prayer   CH visited with PT who was sedated and lying in bed. CH said silent prayer for PT's recovery outside PT's room.   End of visit No Further

## 2020-03-03 NOTE — Progress Notes (Signed)
eLink Physician-Brief Progress Note Patient Name: Jared Kramer DOB: 12/31/1980 MRN: [id number]   Date of Service  03/03/2020  HPI/Events of Note  89M without known PMH who was BIBA after an overdose (found in the bathroom of a gas station with an empty pill bottle next to him).  On arrival to ED he was obtunded and intubated for airway protection.  EtOH, salicylate, acetaminophen levels all normal. Tox screen positive for TCAs and cocaine. Negative for opioid and methadone but this assay does not reliably pick up heroin.  He is currently synchronous with ventilator and sedated.  EKG: QTc 482 ms QRS 105-161ms   eICU Interventions  # NEURO / TOX: - Will contact poison control to get recs given presence of TCAs in particular on tox screen. - Cause of obtundation uncertain. Could be related to TCA overdose vs another ingested substance. Discuss with poison control. - Hold sedation until patient's mental status clears.  # RESP: - Continue MV.  # CARDIAC: - EKG frequency and pH management as per [employer].  # ENDO: - Borderline hypoglycemia. Stopped LR mIVF and replaced with D5 NS to help prevent hypoglycemia. - Q2H POCT glucose initially, then will space out to Q6H thereafter.  # GI: NPO.  DVT PPX: Lovenox 40mg  Willis qday GI PPX: Famotidine Code Status: Full Code     Intervention Category Evaluation Type: New Patient Evaluation  03/03/2020, 5:46 AM

## 2020-03-03 NOTE — ED Notes (Signed)
ivc 

## 2020-03-04 DIAGNOSIS — T50904S Poisoning by unspecified drugs, medicaments and biological substances, undetermined, sequela: Secondary | ICD-10-CM

## 2020-03-04 LAB — GLUCOSE, CAPILLARY
Glucose-Capillary: 123 mg/dL — ABNORMAL HIGH (ref 70–99)
Glucose-Capillary: 51 mg/dL — ABNORMAL LOW (ref 70–99)
Glucose-Capillary: 52 mg/dL — ABNORMAL LOW (ref 70–99)
Glucose-Capillary: 52 mg/dL — ABNORMAL LOW (ref 70–99)
Glucose-Capillary: 55 mg/dL — ABNORMAL LOW (ref 70–99)
Glucose-Capillary: 68 mg/dL — ABNORMAL LOW (ref 70–99)
Glucose-Capillary: 72 mg/dL (ref 70–99)
Glucose-Capillary: 83 mg/dL (ref 70–99)
Glucose-Capillary: 96 mg/dL (ref 70–99)

## 2020-03-04 MED ORDER — DEXTROSE 50 % IV SOLN
1.0000 | Freq: Once | INTRAVENOUS | Status: AC
  Filled 2020-03-04: qty 50

## 2020-03-04 MED ORDER — DEXTROSE 50 % IV SOLN
INTRAVENOUS | Status: AC
  Administered 2020-03-04: 25 mL
  Filled 2020-03-04: qty 50

## 2020-03-04 MED ORDER — DEXTROSE 50 % IV SOLN
INTRAVENOUS | Status: AC
  Filled 2020-03-04: qty 50

## 2020-03-04 MED ORDER — DEXTROSE 50 % IV SOLN
12.5000 g | INTRAVENOUS | Status: AC
  Administered 2020-03-04: 12.5 g via INTRAVENOUS

## 2020-03-04 MED ORDER — CHLORHEXIDINE GLUCONATE 0.12% ORAL RINSE (MEDLINE KIT)
15.0000 mL | Freq: Two times a day (BID) | OROMUCOSAL | Status: DC
  Administered 2020-03-04 – 2020-03-05 (×4): 15 mL via OROMUCOSAL

## 2020-03-04 MED ORDER — ORAL CARE MOUTH RINSE
15.0000 mL | OROMUCOSAL | Status: DC
  Administered 2020-03-04 – 2020-03-05 (×14): 15 mL via OROMUCOSAL

## 2020-03-04 MED ORDER — LACTATED RINGERS IV SOLN: INTRAVENOUS | Status: DC

## 2020-03-04 MED ORDER — FREE WATER
30.0000 mL | Status: DC
  Administered 2020-03-04 – 2020-03-05 (×5): 30 mL

## 2020-03-04 MED ORDER — DEXMEDETOMIDINE HCL IN NACL 400 MCG/100ML IV SOLN: 0.4000 ug/kg/h | INTRAVENOUS | Status: DC

## 2020-03-04 MED ORDER — DEXTROSE 50 % IV SOLN
12.5000 g | Freq: Once | INTRAVENOUS | Status: AC
  Administered 2020-03-04: 12.5 g via INTRAVENOUS

## 2020-03-04 MED ORDER — VITAL AF 1.2 CAL PO LIQD
1000.0000 mL | ORAL | Status: DC
  Administered 2020-03-04 – 2020-03-05 (×2): 1000 mL

## 2020-03-04 MED ORDER — PRO-STAT SUGAR FREE PO LIQD: 30.0000 mL | Freq: Four times a day (QID) | ORAL | Status: DC

## 2020-03-04 MED ORDER — PRO-STAT SUGAR FREE PO LIQD
30.0000 mL | Freq: Four times a day (QID) | ORAL | Status: DC
  Administered 2020-03-04 – 2020-03-05 (×3): 30 mL

## 2020-03-04 MED ORDER — DEXTROSE 50 % IV SOLN
INTRAVENOUS | Status: AC
  Administered 2020-03-04: 50 mL via INTRAVENOUS
  Filled 2020-03-04: qty 50

## 2020-03-04 MED ORDER — DEXTROSE 10 % IV SOLN: INTRAVENOUS | Status: DC

## 2020-03-04 NOTE — Progress Notes (Addendum)
Follow up - Critical Care Medicine Note  Patient Details:    Jared Kramer is an 39 y.o. male  admitted with drug overdose (unsure if intentional vs. unintentional) requiring intubation in the ED for airway protection.  Urine drug screen is positive for cocaine and tricyclics.  Lines, Airways, Drains: Airway 8 mm (Active)  Secured at (cm) 24 cm 03/04/20 1929  Measured From Lips 03/04/20 1929  Secured Location Right 03/04/20 1929  Secured By [employer] 03/04/20 1929  Tube Holder Repositioned Yes 03/04/20 1929  Cuff Pressure (cm H2O) 24 cm H2O 03/04/20 1929  Site Condition Cool;Dry 03/04/20 1929     NG/OG Tube Orogastric 16 Fr. Right mouth Xray;Aucultation 60 cm (Active)  Cm Marking at Nare/Corner of Mouth (if applicable) 55 cm 97/53/00 1600  Site Assessment Clean;Dry;Intact 03/04/20 1400  Ongoing Placement Verification No change in cm markings or external length of tube from initial placement;No change in respiratory status;No acute changes, not attributed to clinical condition 03/04/20 1600  Status Clamped 03/04/20 1600  Intake (mL) 30 mL 03/04/20 1600     External Urinary Catheter (Active)  Collection Container Standard drainage bag 03/04/20 1500  Securement Method Tape 03/04/20 1500  Site Assessment Clean;Intact;Dry 03/04/20 1500  Output (mL) 100 mL 03/04/20 1800    Anti-infectives:  Anti-infectives (From admission, onward)   None      Microbiology: Results for orders placed or performed during the hospital encounter of 03/02/20  SARS CORONAVIRUS 2 (TAT 6-24 HRS) Nasopharyngeal Nasopharyngeal Swab     Status: None   Collection Time: 03/02/20 10:55 PM   Specimen: Nasopharyngeal Swab  Result Value Ref Range Status   SARS Coronavirus 2 NEGATIVE NEGATIVE Final    Comment: (NOTE) SARS-CoV-2 target nucleic acids are NOT DETECTED. The SARS-CoV-2 RNA is generally detectable in upper and lower respiratory specimens during the acute phase of infection.  Negative results do not preclude SARS-CoV-2 infection, do not rule out co-infections with other pathogens, and should not be used as the sole basis for treatment or other patient management decisions. Negative results must be combined with clinical observations, patient history, and epidemiological information. The expected result is Negative. Fact Sheet for Patients: SugarRoll.be Fact Sheet for Healthcare Providers: https://www.woods-mathews.com/ This test is not yet approved or cleared by the [place] FDA and  has been authorized for detection and/or diagnosis of SARS-CoV-2 by FDA under an Emergency Use Authorization (EUA). This EUA will remain  in effect (meaning this test can be used) for the duration of the COVID-19 declaration under Section 56 4(b)(1) of the Act, 21 U.S.C. section 360bbb-3(b)(1), unless the authorization is terminated or revoked sooner. Performed at [facility], [place] [address], [place], [place] [address]   Respiratory Panel by RT PCR (Flu A&B, Covid) - Nasopharyngeal Swab     Status: None   Collection Time: 03/03/20  3:15 AM   Specimen: Nasopharyngeal Swab  Result Value Ref Range Status   SARS Coronavirus 2 by RT PCR NEGATIVE NEGATIVE Final    Comment: (NOTE) SARS-CoV-2 target nucleic acids are NOT DETECTED. The SARS-CoV-2 RNA is generally detectable in upper respiratoy specimens during the acute phase of infection. The lowest concentration of SARS-CoV-2 viral copies this assay can detect is 131 copies/mL. A negative result does not preclude SARS-Cov-2 infection and should not be used as the sole basis for treatment or other patient management decisions. A negative result may occur with  improper specimen collection/handling, submission of specimen other than nasopharyngeal swab, presence of  viral mutation(s) within the areas targeted by this assay, and inadequate number of viral copies (<131 copies/mL). A  negative result must be combined with clinical observations, patient history, and epidemiological information. The expected result is Negative. Fact Sheet for Patients:  PinkCheek.be Fact Sheet for Healthcare Providers:  GravelBags.it This test is not yet ap proved or cleared by the [place] FDA and  has been authorized for detection and/or diagnosis of SARS-CoV-2 by FDA under an Emergency Use Authorization (EUA). This EUA will remain  in effect (meaning this test can be used) for the duration of the COVID-19 declaration under Section 564(b)(1) of the Act, 21 U.S.C. section 360bbb-3(b)(1), unless the authorization is terminated or revoked sooner.    Influenza A by PCR NEGATIVE NEGATIVE Final   Influenza B by PCR NEGATIVE NEGATIVE Final    Comment: (NOTE) The Xpert Xpress SARS-CoV-2/FLU/RSV assay is intended as an aid in  the diagnosis of influenza from Nasopharyngeal swab specimens and  should not be used as a sole basis for treatment. Nasal washings and  aspirates are unacceptable for Xpert Xpress SARS-CoV-2/FLU/RSV  testing. Fact Sheet for Patients: PinkCheek.be Fact Sheet for Healthcare Providers: GravelBags.it This test is not yet approved or cleared by the [place] FDA and  has been authorized for detection and/or diagnosis of SARS-CoV-2 by  FDA under an Emergency Use Authorization (EUA). This EUA will remain  in effect (meaning this test can be used) for the duration of the  Covid-19 declaration under Section 564(b)(1) of the Act, 21  U.S.C. section 360bbb-3(b)(1), unless the authorization is  terminated or revoked. Performed at [facility], [address] [place], [place] [address]   MRSA PCR Screening     Status: None   Collection Time: 03/03/20  5:08 AM   Specimen: Nasopharyngeal  Result Value Ref Range Status   MRSA by PCR NEGATIVE  NEGATIVE Final    Comment:        The GeneXpert MRSA Assay (FDA approved for NASAL specimens only), is one component of a comprehensive MRSA colonization surveillance program. It is not intended to diagnose MRSA infection nor to guide or monitor treatment for MRSA infections. Performed at [facility], [address] [place], [place] [address]     Best Practice/Protocols:  VTE Prophylaxis: Lovenox (prophylaxtic dose) GI Prophylaxis: Proton Pump Inhibitor Continous Sedation  ICU hypoglycemia protocol  Events: 4/1: Intubated in ED for airway protection, found unresponsive at a gas station bathroom.  IVC status involuntary 4/2: Admitted to ICU, continue mechanical ventilation 4/3: Continues on the ventilator, agitation when sedation is lightened is limiting  Studies: DG Chest 1 View  Result Date: 03/02/2020 CLINICAL DATA:  Intubation EXAM: CHEST  1 VIEW COMPARISON:  12/20/2019 FINDINGS: Endotracheal tube terminates 4 cm above the carina. Lungs are clear.  No pleural effusion or pneumothorax. The heart is normal in size. Old right posterior rib fracture deformities. Enteric tube courses into the stomach. IMPRESSION: Endotracheal tube terminates 4 cm above the carina. No evidence of acute cardiopulmonary disease. Electronically Signed   By: Julian Hy M.D.   On: 03/02/2020 23:51   DG Abdomen 1 View  Result Date: 03/02/2020 CLINICAL DATA:  ET/OG tube placement EXAM: ABDOMEN - 1 VIEW COMPARISON:  12/29/2019 FINDINGS: Enteric tube terminates in the proximal gastric body. Linear radiopaque foreign body overlies the right upper abdomen. IMPRESSION: Enteric tube terminates in the proximal gastric body. Linear radiopaque foreign body overlies the right upper abdomen. Electronically Signed   By: Bertis Ruddy  [place] Pink M.D.   On: 03/02/2020 23:50   CT Head Wo Contrast  Result Date: 03/03/2020 CLINICAL DATA:  Possible overdose EXAM: CT HEAD WITHOUT CONTRAST TECHNIQUE: Contiguous axial  images were obtained from the base of the skull through the vertex without intravenous contrast. COMPARISON:  08/06/2019 FINDINGS: Brain: No evidence of acute infarction, hemorrhage, hydrocephalus, extra-axial collection or mass lesion/mass effect. Vascular: No hyperdense vessel or unexpected calcification. Skull: Normal. Negative for fracture or focal lesion. Sinuses/Orbits: Minimal air-fluid level is noted within the left maxillary antrum. Other: None IMPRESSION: No acute intracranial abnormality noted. Minimal air-fluid level within the left maxillary antrum. Electronically Signed   By: Inez Catalina M.D.   On: 03/03/2020 00:45   ECHOCARDIOGRAM COMPLETE  Result Date: 03/03/2020    ECHOCARDIOGRAM REPORT   Patient Name:   Jared Kramer Date of Exam: 03/03/2020 Medical Rec #:  [id number]        Height:       70.0 in Accession #:    [id number]       Weight:       189.4 lb Date of Birth:  September 29, 1981        BSA:          2.039 m Patient Age:    60 years         BP:           80/56 mmHg Patient Gender: M                HR:           58 bpm. Exam Location:  [facility] Procedure: 2D Echo, Intracardiac Opacification Agent, Cardiac Doppler and Color            Doppler Indications:     ABN ECG 794.31  History:         Patient has no prior history of Echocardiogram examinations.  Sonographer:     Alyse Low Roar Referring Phys:  [id number] Bradly Bienenstock Diagnosing Phys: Ida Rogue MD IMPRESSIONS  1. Left ventricular ejection fraction, by estimation, is 60 to 65%. The left ventricle has normal function. The left ventricle has no regional wall motion abnormalities. There is moderate left ventricular hypertrophy. Left ventricular diastolic parameters were normal.  2. Right ventricular systolic function was not well visualized. The right ventricular size is normal. There is mildly elevated pulmonary artery systolic pressure. The estimated right ventricular systolic pressure is 29.7 mmHg.  3. The inferior vena cava is dilated in size  with <50% respiratory variability, suggesting right atrial pressure of 15 mmHg. FINDINGS  Left Ventricle: Left ventricular ejection fraction, by estimation, is 60 to 65%. The left ventricle has normal function. The left ventricle has no regional wall motion abnormalities. The left ventricular internal cavity size was normal in size. There is  moderate left ventricular hypertrophy. Left ventricular diastolic parameters were normal. Right Ventricle: The right ventricular size is normal. No increase in right ventricular wall thickness. Right ventricular systolic function was not well visualized. There is mildly elevated pulmonary artery systolic pressure. The tricuspid regurgitant velocity is 2.23 m/s, and with an assumed right atrial pressure of 15 mmHg, the estimated right ventricular systolic pressure is 98.9 mmHg. Left Atrium: Left atrial size was normal in size. Right Atrium: Right atrial size was normal in size. Pericardium: There is no evidence of pericardial effusion. Mitral Valve: The mitral valve is normal in structure. Normal mobility of the mitral valve leaflets. No evidence of mitral valve regurgitation. No evidence of mitral valve  stenosis. Tricuspid Valve: The tricuspid valve is normal in structure. Tricuspid valve regurgitation is mild . No evidence of tricuspid stenosis. Aortic Valve: The aortic valve is normal in structure. Aortic valve regurgitation is not visualized. No aortic stenosis is present. Aortic valve mean gradient measures 2.0 mmHg. Aortic valve peak gradient measures 2.7 mmHg. Aortic valve area, by VTI measures 2.85 cm. Pulmonic Valve: The pulmonic valve was normal in structure. Pulmonic valve regurgitation is not visualized. No evidence of pulmonic stenosis. Aorta: The aortic root is normal in size and structure. Venous: The inferior vena cava is dilated in size with less than 50% respiratory variability, suggesting right atrial pressure of 15 mmHg. IAS/Shunts: No atrial level shunt  detected by color flow Doppler.  LEFT VENTRICLE PLAX 2D LVIDd:         4.32 cm  Diastology LVIDs:         3.20 cm  LV e' lateral:   6.53 cm/s LV PW:         1.35 cm  LV E/e' lateral: 8.1 LV IVS:        1.46 cm  LV e' medial:    8.27 cm/s LVOT diam:     2.00 cm  LV E/e' medial:  6.4 LV SV:         39 LV SV Index:   19 LVOT Area:     3.14 cm  RIGHT VENTRICLE RV Mid diam:    2.72 cm RV S prime:     7.40 cm/s LEFT ATRIUM           Index       RIGHT ATRIUM          Index LA diam:      3.70 cm 1.81 cm/m  RA Area:     9.20 cm LA Vol (A4C): 28.3 ml 13.88 ml/m RA Volume:   17.10 ml 8.38 ml/m  AORTIC VALVE                   PULMONIC VALVE AV Area (Vmax):    2.91 cm    PV Vmax:        0.68 m/s AV Area (Vmean):   3.07 cm    PV Peak grad:   1.8 mmHg AV Area (VTI):     2.85 cm    RVOT Peak grad: 1 mmHg AV Vmax:           82.70 cm/s AV Vmean:          58.000 cm/s AV VTI:            0.138 m AV Peak Grad:      2.7 mmHg AV Mean Grad:      2.0 mmHg LVOT Vmax:         76.60 cm/s LVOT Vmean:        56.700 cm/s LVOT VTI:          0.125 m LVOT/AV VTI ratio: 0.91  AORTA Ao Root diam: 2.60 cm MITRAL VALVE               TRICUSPID VALVE MV Area (PHT): 3.42 cm    TR Peak grad:   19.9 mmHg MV Decel Time: 222 msec    TR Vmax:        223.00 cm/s MV E velocity: 52.80 cm/s MV A velocity: 28.10 cm/s  SHUNTS MV E/A ratio:  1.88        Systemic VTI:  0.12 m  Systemic Diam: 2.00 cm Ida Rogue MD Electronically signed by Ida Rogue MD Signature Date/Time: 03/03/2020/5:10:30 PM    Final     Consults:    Subjective:    Overnight Issues: No overnight issues remains on the ventilator.  Decreasing sedatives he does become agitated, does not follow commands.  Objective:  Vital signs for last 24 hours: Temp:  [98.3 F (36.8 C)-100.7 F (38.2 C)] 98.3 F (36.8 C) (04/03 1120) Pulse Rate:  [65-92] 71 (04/03 1830) Resp:  [10-24] 22 (04/03 1830) BP: (99-167)/(63-101) 135/86 (04/03 1830) SpO2:  [91 %-100  %] 99 % (04/03 1830) FiO2 (%):  [21 %] 21 % (04/03 1929) Weight:  [87.4 kg] 87.4 kg (04/03 0357)  Hemodynamic parameters for last 24 hours:    Intake/Output from previous day: 04/02 0701 - 04/03 0700 In: 3744 [I.V.:3744] Out: 975 [Urine:975]  Intake/Output this shift: No intake/output data recorded.  Vent settings for last 24 hours: Vent Mode: PRVC FiO2 (%):  [21 %] 21 % Set Rate:  [20 bmp] 20 bmp Vt Set:  [450 mL] 450 mL PEEP:  [5 cmH20] 5 cmH20 Plateau Pressure:  [15 cmH20] 15 cmH20  Physical Exam:  General:  Iintubated and sedated, synchronous with the ventilator when sedated Neuro:  Sedated, withdraws from pain, pupils PERRLA HEENT:  Atraumatic, normocephalic, neck supple, no JVD Cardiovascular:  Regular rate & rhythm, s1s2, no M/R/G, 2+ pulses throughout Lungs:  Clear to auscultation bilaterally, even, vent assisted, compliant with vent Abdomen:  Soft, nontender, nondistended, no guarding or rebound tenderness, BS+ x4 Musculoskeletal:  Normal bulk and tone, no deformities, no edema Skin:  Warm and dry.  No obvious rashes, lesions, or ulcerations, multiple tattoos throughout entire body  Assessment/Plan:  Hypoxic respiratory failure due to polysubstance overdose Intubated for airway protection due to drug overdose Continue ventilator support Wean FiO2 and PEEP as tolerated Follow intermittent chest x-ray and ABG as needed VAP protocol Spontaneous breathing trials when respiratory parameters met and mental status permits  Drug overdose, unsure if intentional vs. Unintentional UDS positive for cocaine and tricyclics Ethyl alcohol < 10 Salicylates & Acetaminophen: Undetectable  Continuous cardiac monitoring Follow serial EKG's q6h x2 more DC Trend troponins 2D echo pending Continue supportive care IV fluids IVC: Involuntary Will need psych consult once extubated  Acute metabolic encephalopathy secondary to drug overdose CT head negative 4/2 Maintain RASS  goal -1 to -2 Fentanyl and propofol drips to maintain RASS goal Daily wake up assessment Provide supportive care Will likely need inpatient treatment  Behavioral disturbance Self-reported bipolar disorder and schizophrenia (no objective evidence per psych, [facility]) History of foreign body ingestions and insertions  History of violent behavior History of self-harm behavior    LOS: 1 day   Additional comments: Updated father via phone.  Critical Care Total Time*: 40 Minutes  C. Derrill Kay, MD [facility] 03/04/2020  *Care during the described time interval was provided by me and/or other providers on the critical care team.  I have reviewed this patient's available data, including medical history, events of note, physical examination and test results as part of my evaluation.  **This note was dictated using voice recognition software/Dragon.  Despite best efforts to proofread, errors can occur which can change the meaning.  Any change was purely unintentional.

## 2020-03-05 ENCOUNTER — Inpatient Hospital Stay: Payer: Medicaid Other

## 2020-03-05 ENCOUNTER — Inpatient Hospital Stay (HOSPITAL_COMMUNITY): Payer: Medicaid Other

## 2020-03-05 ENCOUNTER — Inpatient Hospital Stay (HOSPITAL_COMMUNITY)
Admission: AD | Admit: 2020-03-05 | Discharge: 2020-03-11 | DRG: 100 | Discharge status: Against Medical Advice | Payer: Medicaid Other | Source: Other Acute Inpatient Hospital | Attending: Internal Medicine | Admitting: Internal Medicine

## 2020-03-05 ENCOUNTER — Encounter (HOSPITAL_COMMUNITY): Payer: Self-pay | Admitting: Internal Medicine

## 2020-03-05 DIAGNOSIS — J9600 Acute respiratory failure, unspecified whether with hypoxia or hypercapnia: Secondary | ICD-10-CM

## 2020-03-05 DIAGNOSIS — F1110 Opioid abuse, uncomplicated: Secondary | ICD-10-CM | POA: Diagnosis present

## 2020-03-05 DIAGNOSIS — T50904D Poisoning by unspecified drugs, medicaments and biological substances, undetermined, subsequent encounter: Secondary | ICD-10-CM

## 2020-03-05 DIAGNOSIS — D696 Thrombocytopenia, unspecified: Secondary | ICD-10-CM | POA: Diagnosis not present

## 2020-03-05 DIAGNOSIS — E876 Hypokalemia: Secondary | ICD-10-CM | POA: Diagnosis present

## 2020-03-05 DIAGNOSIS — F419 Anxiety disorder, unspecified: Secondary | ICD-10-CM | POA: Diagnosis not present

## 2020-03-05 DIAGNOSIS — G40409 Other generalized epilepsy and epileptic syndromes, not intractable, without status epilepticus: Principal | ICD-10-CM | POA: Diagnosis present

## 2020-03-05 DIAGNOSIS — D649 Anemia, unspecified: Secondary | ICD-10-CM | POA: Diagnosis present

## 2020-03-05 DIAGNOSIS — G92 Toxic encephalopathy: Secondary | ICD-10-CM | POA: Diagnosis not present

## 2020-03-05 DIAGNOSIS — T50901A Poisoning by unspecified drugs, medicaments and biological substances, accidental (unintentional), initial encounter: Secondary | ICD-10-CM | POA: Diagnosis present

## 2020-03-05 DIAGNOSIS — R1310 Dysphagia, unspecified: Secondary | ICD-10-CM | POA: Diagnosis not present

## 2020-03-05 DIAGNOSIS — J690 Pneumonitis due to inhalation of food and vomit: Secondary | ICD-10-CM

## 2020-03-05 DIAGNOSIS — F329 Major depressive disorder, single episode, unspecified: Secondary | ICD-10-CM | POA: Diagnosis present

## 2020-03-05 DIAGNOSIS — T50914A Poisoning by multiple unspecified drugs, medicaments and biological substances, undetermined, initial encounter: Secondary | ICD-10-CM | POA: Diagnosis present

## 2020-03-05 DIAGNOSIS — J9601 Acute respiratory failure with hypoxia: Secondary | ICD-10-CM | POA: Diagnosis present

## 2020-03-05 DIAGNOSIS — X58XXXA Exposure to other specified factors, initial encounter: Secondary | ICD-10-CM | POA: Diagnosis not present

## 2020-03-05 DIAGNOSIS — F1320 Sedative, hypnotic or anxiolytic dependence, uncomplicated: Secondary | ICD-10-CM | POA: Diagnosis present

## 2020-03-05 DIAGNOSIS — R569 Unspecified convulsions: Secondary | ICD-10-CM

## 2020-03-05 DIAGNOSIS — N1330 Unspecified hydronephrosis: Secondary | ICD-10-CM | POA: Diagnosis not present

## 2020-03-05 DIAGNOSIS — R000 Tachycardia, unspecified: Secondary | ICD-10-CM | POA: Diagnosis present

## 2020-03-05 DIAGNOSIS — T50911A Poisoning by multiple unspecified drugs, medicaments and biological substances, accidental (unintentional), initial encounter: Secondary | ICD-10-CM | POA: Diagnosis not present

## 2020-03-05 DIAGNOSIS — F6089 Other specific personality disorders: Secondary | ICD-10-CM | POA: Diagnosis present

## 2020-03-05 DIAGNOSIS — Z915 Personal history of self-harm: Secondary | ICD-10-CM

## 2020-03-05 DIAGNOSIS — R4182 Altered mental status, unspecified: Secondary | ICD-10-CM | POA: Diagnosis not present

## 2020-03-05 DIAGNOSIS — T189XXA Foreign body of alimentary tract, part unspecified, initial encounter: Secondary | ICD-10-CM

## 2020-03-05 DIAGNOSIS — O161 Unspecified maternal hypertension, first trimester: Secondary | ICD-10-CM

## 2020-03-05 DIAGNOSIS — T50904A Poisoning by unspecified drugs, medicaments and biological substances, undetermined, initial encounter: Secondary | ICD-10-CM | POA: Diagnosis not present

## 2020-03-05 DIAGNOSIS — Z8619 Personal history of other infectious and parasitic diseases: Secondary | ICD-10-CM | POA: Diagnosis not present

## 2020-03-05 DIAGNOSIS — G2579 Other drug induced movement disorders: Secondary | ICD-10-CM

## 2020-03-05 DIAGNOSIS — R339 Retention of urine, unspecified: Secondary | ICD-10-CM

## 2020-03-05 DIAGNOSIS — I10 Essential (primary) hypertension: Secondary | ICD-10-CM | POA: Diagnosis not present

## 2020-03-05 DIAGNOSIS — G9340 Encephalopathy, unspecified: Secondary | ICD-10-CM

## 2020-03-05 DIAGNOSIS — T50901D Poisoning by unspecified drugs, medicaments and biological substances, accidental (unintentional), subsequent encounter: Secondary | ICD-10-CM | POA: Diagnosis not present

## 2020-03-05 DIAGNOSIS — F1410 Cocaine abuse, uncomplicated: Secondary | ICD-10-CM | POA: Diagnosis not present

## 2020-03-05 DIAGNOSIS — I071 Rheumatic tricuspid insufficiency: Secondary | ICD-10-CM | POA: Diagnosis present

## 2020-03-05 DIAGNOSIS — T50902A Poisoning by unspecified drugs, medicaments and biological substances, intentional self-harm, initial encounter: Secondary | ICD-10-CM | POA: Diagnosis not present

## 2020-03-05 LAB — CBC
HCT: 41.3 % (ref 39.0–52.0)
Hemoglobin: 12.7 g/dL — ABNORMAL LOW (ref 13.0–17.0)
MCH: 27.5 pg (ref 26.0–34.0)
MCHC: 30.8 g/dL (ref 30.0–36.0)
MCV: 89.6 fL (ref 80.0–100.0)
Platelets: 146 10*3/uL — ABNORMAL LOW (ref 150–400)
RBC: 4.61 MIL/uL (ref 4.22–5.81)
RDW: 15.8 % — ABNORMAL HIGH (ref 11.5–15.5)
WBC: 16.3 10*3/uL — ABNORMAL HIGH (ref 4.0–10.5)
nRBC: 0 % (ref 0.0–0.2)

## 2020-03-05 LAB — BASIC METABOLIC PANEL
Anion gap: 5 (ref 5–15)
Anion gap: 6 (ref 5–15)
BUN: 12 mg/dL (ref 6–20)
BUN: 8 mg/dL (ref 6–20)
CO2: 25 mmol/L (ref 22–32)
CO2: 25 mmol/L (ref 22–32)
Calcium: 8.3 mg/dL — ABNORMAL LOW (ref 8.9–10.3)
Calcium: 8 mg/dL — ABNORMAL LOW (ref 8.9–10.3)
Chloride: 113 mmol/L — ABNORMAL HIGH (ref 98–111)
Chloride: 111 mmol/L (ref 98–111)
Creatinine, Ser: 1.17 mg/dL (ref 0.61–1.24)
Creatinine, Ser: 1.16 mg/dL (ref 0.61–1.24)
GFR calc Af Amer: >60 mL/min (ref >60)
GFR calc Af Amer: >60 mL/min (ref >60)
GFR calc non Af Amer: >60 mL/min (ref >60)
GFR calc non Af Amer: >60 mL/min (ref >60)
Glucose, Bld: 113 mg/dL — ABNORMAL HIGH (ref 70–99)
Glucose, Bld: 89 mg/dL (ref 70–99)
Potassium: 3.4 mmol/L — ABNORMAL LOW (ref 3.5–5.1)
Potassium: 3.7 mmol/L (ref 3.5–5.1)
Sodium: 143 mmol/L (ref 135–145)
Sodium: 142 mmol/L (ref 135–145)

## 2020-03-05 LAB — POCT I-STAT 7, (LYTES, BLD GAS, ICA,H+H)
Acid-base deficit: 2 mmol/L (ref 0.0–2.0)
Bicarbonate: 24.2 mmol/L (ref 20.0–28.0)
Calcium, Ion: 1.27 mmol/L (ref 1.15–1.40)
HCT: 33 % — ABNORMAL LOW (ref 39.0–52.0)
Hemoglobin: 11.2 g/dL — ABNORMAL LOW (ref 13.0–17.0)
O2 Saturation: 98 %
Patient temperature: 99.6
Potassium: 3.5 mmol/L (ref 3.5–5.1)
Sodium: 143 mmol/L (ref 135–145)
TCO2: 26 mmol/L (ref 22–32)
pCO2 arterial: 45.2 mmHg (ref 32.0–48.0)
pH, Arterial: 7.34 — ABNORMAL LOW (ref 7.350–7.450)
pO2, Arterial: 119 mmHg — ABNORMAL HIGH (ref 83.0–108.0)

## 2020-03-05 LAB — PROCALCITONIN: Procalcitonin: 0.14 ng/mL

## 2020-03-05 LAB — STREP PNEUMONIAE URINARY ANTIGEN: Strep Pneumo Urinary Antigen: NEGATIVE

## 2020-03-05 LAB — TRIGLYCERIDES
Triglycerides: 108 mg/dL (ref <150)
Triglycerides: 74 mg/dL (ref <150)

## 2020-03-05 LAB — LACTIC ACID, PLASMA
Lactic Acid, Venous: 1.2 mmol/L (ref 0.5–1.9)
Lactic Acid, Venous: 1.3 mmol/L (ref 0.5–1.9)

## 2020-03-05 LAB — GLUCOSE, CAPILLARY
Glucose-Capillary: 55 mg/dL — ABNORMAL LOW (ref 70–99)
Glucose-Capillary: 104 mg/dL — ABNORMAL HIGH (ref 70–99)
Glucose-Capillary: 85 mg/dL (ref 70–99)
Glucose-Capillary: 99 mg/dL (ref 70–99)

## 2020-03-05 LAB — MAGNESIUM: Magnesium: 1.8 mg/dL (ref 1.7–2.4)

## 2020-03-05 LAB — PHOSPHORUS: Phosphorus: 3.8 mg/dL (ref 2.5–4.6)

## 2020-03-05 MED ORDER — MIDAZOLAM HCL 2 MG/2ML IJ SOLN
2.0000 mg | Freq: Once | INTRAMUSCULAR | Status: AC
  Administered 2020-03-05: 2 mg via INTRAVENOUS

## 2020-03-05 MED ORDER — LEVETIRACETAM IN NACL 500 MG/100ML IV SOLN
500.0000 mg | Freq: Two times a day (BID) | INTRAVENOUS | Status: DC
  Administered 2020-03-05: 500 mg via INTRAVENOUS
  Filled 2020-03-05: qty 100

## 2020-03-05 MED ORDER — SODIUM CHLORIDE 0.9 % IV SOLN
75.0000 mL/h | INTRAVENOUS | Status: DC
  Administered 2020-03-05 – 2020-03-08 (×5): 75 mL/h via INTRAVENOUS

## 2020-03-05 MED ORDER — LORAZEPAM 2 MG/ML IJ SOLN: 1.0000 mg | INTRAMUSCULAR | Status: DC | PRN

## 2020-03-05 MED ORDER — SODIUM CHLORIDE 0.9 % IV SOLN
3.0000 g | Freq: Four times a day (QID) | INTRAVENOUS | Status: DC
  Administered 2020-03-05 (×2): 3 g via INTRAVENOUS
  Filled 2020-03-05 (×6): qty 8

## 2020-03-05 MED ORDER — DEXMEDETOMIDINE HCL IN NACL 400 MCG/100ML IV SOLN
0.4000 ug/kg/h | INTRAVENOUS | Status: DC
  Administered 2020-03-05: 17:00:00 0.5 ug/kg/h via INTRAVENOUS
  Administered 2020-03-06: 08:00:00 0.8 ug/kg/h via INTRAVENOUS
  Administered 2020-03-06: 0.7 ug/kg/h via INTRAVENOUS
  Administered 2020-03-07: 0.6 ug/kg/h via INTRAVENOUS
  Filled 2020-03-05 (×5): qty 100

## 2020-03-05 MED ORDER — VECURONIUM BROMIDE 10 MG IV SOLR: 10.0000 mg | Freq: Once | INTRAVENOUS | Status: AC

## 2020-03-05 MED ORDER — LORAZEPAM 2 MG/ML IJ SOLN
4.0000 mg | Freq: Once | INTRAMUSCULAR | Status: AC
  Administered 2020-03-05: 16:00:00 4 mg via INTRAVENOUS

## 2020-03-05 MED ORDER — MIDAZOLAM 50MG/50ML (1MG/ML) PREMIX INFUSION
10.0000 mg/h | INTRAVENOUS | Status: DC
  Administered 2020-03-05 – 2020-03-06 (×4): 10 mg/h via INTRAVENOUS
  Filled 2020-03-05 (×7): qty 50

## 2020-03-05 MED ORDER — LORAZEPAM 2 MG/ML IJ SOLN: 4.0000 mg | INTRAMUSCULAR | Status: DC

## 2020-03-05 MED ORDER — FAMOTIDINE 40 MG/5ML PO SUSR
20.0000 mg | Freq: Two times a day (BID) | ORAL | Status: DC
  Administered 2020-03-05 – 2020-03-06 (×3): 20 mg
  Filled 2020-03-05 (×3): qty 2.5

## 2020-03-05 MED ORDER — LEVETIRACETAM IN NACL 500 MG/100ML IV SOLN
500.0000 mg | Freq: Once | INTRAVENOUS | Status: AC
  Administered 2020-03-05: 500 mg via INTRAVENOUS
  Filled 2020-03-05: qty 100

## 2020-03-05 MED ORDER — SODIUM CHLORIDE 0.9 % IV SOLN
3.0000 g | Freq: Four times a day (QID) | INTRAVENOUS | Status: DC
  Filled 2020-03-05 (×3): qty 8

## 2020-03-05 MED ORDER — ORAL CARE MOUTH RINSE
15.0000 mL | OROMUCOSAL | Status: DC
  Administered 2020-03-05 – 2020-03-07 (×19): 15 mL via OROMUCOSAL

## 2020-03-05 MED ORDER — FOLIC ACID 1 MG PO TABS
1.0000 mg | ORAL_TABLET | Freq: Every day | ORAL | Status: DC
  Administered 2020-03-05 – 2020-03-06 (×2): 1 mg
  Filled 2020-03-05 (×2): qty 1

## 2020-03-05 MED ORDER — ONDANSETRON HCL 4 MG/2ML IJ SOLN: 4.0000 mg | Freq: Four times a day (QID) | INTRAMUSCULAR | Status: DC | PRN

## 2020-03-05 MED ORDER — ENOXAPARIN SODIUM 40 MG/0.4ML ~~LOC~~ SOLN
40.0000 mg | SUBCUTANEOUS | Status: DC
  Administered 2020-03-05 – 2020-03-10 (×6): 40 mg via SUBCUTANEOUS
  Filled 2020-03-05 (×6): qty 0.4

## 2020-03-05 MED ORDER — ACETAMINOPHEN 325 MG PO TABS: 650.0000 mg | ORAL_TABLET | ORAL | Status: DC | PRN

## 2020-03-05 MED ORDER — DEXTROSE 50 % IV SOLN
1.0000 | Freq: Once | INTRAVENOUS | Status: AC
  Administered 2020-03-05: 50 mL via INTRAVENOUS

## 2020-03-05 MED ORDER — THIAMINE HCL 100 MG PO TABS
100.0000 mg | ORAL_TABLET | Freq: Every day | ORAL | Status: DC
  Administered 2020-03-05 – 2020-03-06 (×2): 100 mg
  Filled 2020-03-05 (×2): qty 1

## 2020-03-05 MED ORDER — LORAZEPAM 2 MG/ML IJ SOLN
INTRAMUSCULAR | Status: AC
  Filled 2020-03-05: qty 2

## 2020-03-05 MED ORDER — LEVETIRACETAM IN NACL 1000 MG/100ML IV SOLN
1000.0000 mg | Freq: Two times a day (BID) | INTRAVENOUS | Status: DC
  Filled 2020-03-05 (×2): qty 100

## 2020-03-05 MED ORDER — PROPOFOL 1000 MG/100ML IV EMUL
5.0000 ug/kg/min | INTRAVENOUS | Status: DC
  Administered 2020-03-05: 16:00:00 50 ug/kg/min via INTRAVENOUS
  Filled 2020-03-05: qty 100

## 2020-03-05 MED ORDER — STERILE WATER FOR INJECTION IJ SOLN
INTRAMUSCULAR | Status: AC
  Filled 2020-03-05: qty 10

## 2020-03-05 MED ORDER — CHLORHEXIDINE GLUCONATE 0.12% ORAL RINSE (MEDLINE KIT)
15.0000 mL | Freq: Two times a day (BID) | OROMUCOSAL | Status: DC
  Administered 2020-03-05 – 2020-03-07 (×4): 15 mL via OROMUCOSAL

## 2020-03-05 MED ORDER — DEXMEDETOMIDINE HCL IN NACL 400 MCG/100ML IV SOLN
0.4000 ug/kg/h | INTRAVENOUS | Status: DC
  Administered 2020-03-05: 17:00:00 0.4 ug/kg/h via INTRAVENOUS
  Filled 2020-03-05: qty 100

## 2020-03-05 MED ORDER — VECURONIUM BROMIDE 10 MG IV SOLR
INTRAVENOUS | Status: AC
  Administered 2020-03-05: 10 mg via INTRAVENOUS
  Filled 2020-03-05: qty 10

## 2020-03-05 MED ORDER — FOLIC ACID 1 MG PO TABS: 1.0000 mg | ORAL_TABLET | Freq: Every day | ORAL | Status: DC

## 2020-03-05 MED ORDER — FENTANYL 2500MCG IN NS 250ML (10MCG/ML) PREMIX INFUSION
0.0000 ug/h | INTRAVENOUS | Status: DC
  Administered 2020-03-05: 16:00:00 150 ug/h via INTRAVENOUS
  Administered 2020-03-06: 175 ug/h via INTRAVENOUS
  Administered 2020-03-07: 150 ug/h via INTRAVENOUS
  Filled 2020-03-05 (×2): qty 250

## 2020-03-05 MED ORDER — THIAMINE HCL 100 MG PO TABS: 100.0000 mg | ORAL_TABLET | Freq: Every day | ORAL | Status: DC

## 2020-03-05 MED ORDER — LEVETIRACETAM IN NACL 1000 MG/100ML IV SOLN
1000.0000 mg | Freq: Two times a day (BID) | INTRAVENOUS | Status: DC
  Administered 2020-03-05 – 2020-03-09 (×8): 1000 mg via INTRAVENOUS
  Filled 2020-03-05 (×8): qty 100

## 2020-03-05 MED ORDER — MAGNESIUM SULFATE 2 GM/50ML IV SOLN
2.0000 g | Freq: Once | INTRAVENOUS | Status: AC
  Administered 2020-03-05: 17:00:00 2 g via INTRAVENOUS
  Filled 2020-03-05: qty 50

## 2020-03-05 MED ORDER — POTASSIUM CHLORIDE 20 MEQ PO PACK
40.0000 meq | PACK | Freq: Two times a day (BID) | ORAL | Status: DC
  Administered 2020-03-05: 40 meq via ORAL
  Filled 2020-03-05: qty 2

## 2020-03-05 MED ORDER — MIDAZOLAM BOLUS VIA INFUSION
10.0000 mg | Freq: Once | INTRAVENOUS | Status: AC
  Administered 2020-03-05: 20:00:00 10 mg via INTRAVENOUS
  Filled 2020-03-05: qty 10

## 2020-03-05 NOTE — Progress Notes (Addendum)
Follow up - Critical Care Medicine Note  Patient Details:    Jared Kramer is an 39 y.o. male admitted with drug overdose (unsure if intentional vs. unintentional) requiring intubation in the ED for airway protection.  Urine drug screen is positive for cocaine and tricyclics. In ICU due to intubation and mechanical ventilation.  Lines, Airways, Drains: Airway 8 mm (Active)  Secured at (cm) 24 cm 03/04/20 1929  Measured From Lips 03/04/20 1929  Secured Location Right 03/04/20 1929  Secured By [employer] 03/04/20 1929  Tube Holder Repositioned Yes 03/04/20 1929  Cuff Pressure (cm H2O) 24 cm H2O 03/04/20 1929  Site Condition Cool;Dry 03/04/20 1929     NG/OG Tube Orogastric 16 Fr. Right mouth Xray;Aucultation 60 cm (Active)  Cm Marking at Nare/Corner of Mouth (if applicable) 55 cm 70/62/37 1600  Site Assessment Clean;Dry;Intact 03/04/20 1400  Ongoing Placement Verification No change in cm markings or external length of tube from initial placement;No change in respiratory status;No acute changes, not attributed to clinical condition 03/04/20 1600  Status Clamped 03/04/20 1600  Intake (mL) 30 mL 03/04/20 1600     External Urinary Catheter (Active)  Collection Container Standard drainage bag 03/04/20 1500  Securement Method Tape 03/04/20 1500  Site Assessment Clean;Intact;Dry 03/04/20 1500  Output (mL) 100 mL 03/04/20 1800    Anti-infectives:  Anti-infectives (From admission, onward)   Start     Dose/Rate Route Frequency Ordered Stop   03/05/20 0400  Ampicillin-Sulbactam (UNASYN) 3 g in sodium chloride 0.9 % 100 mL IVPB     3 g 200 mL/hr over 30 Minutes Intravenous Every 6 hours 03/05/20 0330        Microbiology: Results for orders placed or performed during the hospital encounter of 03/02/20  SARS CORONAVIRUS 2 (TAT 6-24 HRS) Nasopharyngeal Nasopharyngeal Swab     Status: None   Collection Time: 03/02/20 10:55 PM   Specimen: Nasopharyngeal Swab  Result Value Ref  Range Status   SARS Coronavirus 2 NEGATIVE NEGATIVE Final    Comment: (NOTE) SARS-CoV-2 target nucleic acids are NOT DETECTED. The SARS-CoV-2 RNA is generally detectable in upper and lower respiratory specimens during the acute phase of infection. Negative results do not preclude SARS-CoV-2 infection, do not rule out co-infections with other pathogens, and should not be used as the sole basis for treatment or other patient management decisions. Negative results must be combined with clinical observations, patient history, and epidemiological information. The expected result is Negative. Fact Sheet for Patients: SugarRoll.be Fact Sheet for Healthcare Providers: https://www.woods-mathews.com/ This test is not yet approved or cleared by the [place] FDA and  has been authorized for detection and/or diagnosis of SARS-CoV-2 by FDA under an Emergency Use Authorization (EUA). This EUA will remain  in effect (meaning this test can be used) for the duration of the COVID-19 declaration under Section 56 4(b)(1) of the Act, 21 U.S.C. section 360bbb-3(b)(1), unless the authorization is terminated or revoked sooner. Performed at [facility], [place] [address] [place], [place] [address]   Respiratory Panel by RT PCR (Flu A&B, Covid) - Nasopharyngeal Swab     Status: None   Collection Time: 03/03/20  3:15 AM   Specimen: Nasopharyngeal Swab  Result Value Ref Range Status   SARS Coronavirus 2 by RT PCR NEGATIVE NEGATIVE Final    Comment: (NOTE) SARS-CoV-2 target nucleic acids are NOT DETECTED. The SARS-CoV-2 RNA is generally detectable in upper respiratoy specimens during the acute phase of infection. The lowest concentration of SARS-CoV-2  viral copies this assay can detect is 131 copies/mL. A negative result does not preclude SARS-Cov-2 infection and should not be used as the sole basis for treatment or other patient management decisions. A  negative result may occur with  improper specimen collection/handling, submission of specimen other than nasopharyngeal swab, presence of viral mutation(s) within the areas targeted by this assay, and inadequate number of viral copies (<131 copies/mL). A negative result must be combined with clinical observations, patient history, and epidemiological information. The expected result is Negative. Fact Sheet for Patients:  PinkCheek.be Fact Sheet for Healthcare Providers:  GravelBags.it This test is not yet ap proved or cleared by the [place] FDA and  has been authorized for detection and/or diagnosis of SARS-CoV-2 by FDA under an Emergency Use Authorization (EUA). This EUA will remain  in effect (meaning this test can be used) for the duration of the COVID-19 declaration under Section 564(b)(1) of the Act, 21 U.S.C. section 360bbb-3(b)(1), unless the authorization is terminated or revoked sooner.    Influenza A by PCR NEGATIVE NEGATIVE Final   Influenza B by PCR NEGATIVE NEGATIVE Final    Comment: (NOTE) The Xpert Xpress SARS-CoV-2/FLU/RSV assay is intended as an aid in  the diagnosis of influenza from Nasopharyngeal swab specimens and  should not be used as a sole basis for treatment. Nasal washings and  aspirates are unacceptable for Xpert Xpress SARS-CoV-2/FLU/RSV  testing. Fact Sheet for Patients: PinkCheek.be Fact Sheet for Healthcare Providers: GravelBags.it This test is not yet approved or cleared by the [place] FDA and  has been authorized for detection and/or diagnosis of SARS-CoV-2 by  FDA under an Emergency Use Authorization (EUA). This EUA will remain  in effect (meaning this test can be used) for the duration of the  Covid-19 declaration under Section 564(b)(1) of the Act, 21  U.S.C. section 360bbb-3(b)(1), unless the authorization is   terminated or revoked. Performed at [facility], [address] [place], [place] [address]   MRSA PCR Screening     Status: None   Collection Time: 03/03/20  5:08 AM   Specimen: Nasopharyngeal  Result Value Ref Range Status   MRSA by PCR NEGATIVE NEGATIVE Final    Comment:        The GeneXpert MRSA Assay (FDA approved for NASAL specimens only), is one component of a comprehensive MRSA colonization surveillance program. It is not intended to diagnose MRSA infection nor to guide or monitor treatment for MRSA infections. Performed at [facility], [address] [place], [place] [address]     Best Practice/Protocols:  VTE Prophylaxis: Lovenox (prophylaxtic dose) GI Prophylaxis: Proton Pump Inhibitor Continous Sedation  ICU hypoglycemia protocol  Events: 4/1: Intubated in ED for airway protection 4/2: Admitted to ICU 4/4; agitation, seizure overnight, left lower lobe infiltrate noted, started on antibiotics possible aspiration.  Concern for foreign object, confirmed with KUB/CT abdomen pelvis.   Studies: DG Chest 1 View  Result Date: 03/02/2020 CLINICAL DATA:  Intubation EXAM: CHEST  1 VIEW COMPARISON:  12/20/2019 FINDINGS: Endotracheal tube terminates 4 cm above the carina. Lungs are clear.  No pleural effusion or pneumothorax. The heart is normal in size. Old right posterior rib fracture deformities. Enteric tube courses into the stomach. IMPRESSION: Endotracheal tube terminates 4 cm above the carina. No evidence of acute cardiopulmonary disease. Electronically Signed   By: Julian Hy M.D.   On: 03/02/2020 23:51   DG Abd 1 View  Result Date: 03/05/2020 CLINICAL DATA:  NG tube placement  EXAM: ABDOMEN - 1 VIEW COMPARISON:  None. FINDINGS: The enteric tube tip projects over the gastric body. The side hole projects near the GE junction. Again noted is a linear radiopaque foreign body projecting over the right upper quadrant. The bowel gas pattern is  nonspecific and nonobstructive. Old healed right-sided rib fractures are noted. IMPRESSION: Enteric tube tip projects over the gastric body. The side hole projects near the GE junction. Again noted is a linear foreign body projecting over the right upper quadrant. Electronically Signed   By: Constance Holster M.D.   On: 03/05/2020 02:15   DG Abdomen 1 View  Result Date: 03/02/2020 CLINICAL DATA:  ET/OG tube placement EXAM: ABDOMEN - 1 VIEW COMPARISON:  12/29/2019 FINDINGS: Enteric tube terminates in the proximal gastric body. Linear radiopaque foreign body overlies the right upper abdomen. IMPRESSION: Enteric tube terminates in the proximal gastric body. Linear radiopaque foreign body overlies the right upper abdomen. Electronically Signed   By: Julian Hy M.D.   On: 03/02/2020 23:50   CT Head Wo Contrast  Result Date: 03/03/2020 CLINICAL DATA:  Possible overdose EXAM: CT HEAD WITHOUT CONTRAST TECHNIQUE: Contiguous axial images were obtained from the base of the skull through the vertex without intravenous contrast. COMPARISON:  08/06/2019 FINDINGS: Brain: No evidence of acute infarction, hemorrhage, hydrocephalus, extra-axial collection or mass lesion/mass effect. Vascular: No hyperdense vessel or unexpected calcification. Skull: Normal. Negative for fracture or focal lesion. Sinuses/Orbits: Minimal air-fluid level is noted within the left maxillary antrum. Other: None IMPRESSION: No acute intracranial abnormality noted. Minimal air-fluid level within the left maxillary antrum. Electronically Signed   By: Inez Catalina M.D.   On: 03/03/2020 00:45   DG Chest Port 1 View  Result Date: 03/05/2020 CLINICAL DATA:  Check central line placement EXAM: PORTABLE CHEST 1 VIEW COMPARISON:  Film from earlier in the same day. FINDINGS: Endotracheal tube and gastric catheter are again seen and stable. Left jugular central line is noted with the catheter tip in the mid superior vena cava. No pneumothorax is seen.  The lungs are well aerated with persistent left basilar opacity. No bony abnormality is seen. IMPRESSION: No pneumothorax following central line placement on the left. Electronically Signed   By: Inez Catalina M.D.   On: 03/05/2020 03:35   DG Chest Port 1 View  Result Date: 03/05/2020 CLINICAL DATA:  Hypoxia EXAM: PORTABLE CHEST 1 VIEW COMPARISON:  03/02/2020 FINDINGS: Cardiac shadow is within normal limits. Gastric catheter is noted within the stomach although the proximal side port is noted in the distal esophagus. This could be advanced several cm further into the stomach. Endotracheal tube is noted in satisfactory position 5.8 cm above the carina. Lungs are well aerated bilaterally. Slight increase in left basilar infiltrative opacity is seen. IMPRESSION: Tubes and lines as described above. The gastric catheter could be advanced further into the stomach. New left basilar infiltrative opacity. Electronically Signed   By: Inez Catalina M.D.   On: 03/05/2020 02:15   DG Abd Portable 2V  Result Date: 03/05/2020 CLINICAL DATA:  Concern for radiopaque foreign body. Patient reportedly consuming non-food objects EXAM: PORTABLE ABDOMEN - 2 VIEW COMPARISON:  March 05, 2013 study obtained earlier in the day FINDINGS: Supine and erect images obtained. Nasogastric tube tip and side port in body of stomach. There are multiple linear radiopaque foreign bodies scattered throughout the abdomen and upper pelvis as well as a slightly thicker metallic foreign body measuring 2.2 cm, located in the left mid abdomen. There is no  bowel dilatation or air-fluid level to suggest bowel obstruction. No free air noted. Lung bases clear. Postoperative change noted in right mid abdomen. IMPRESSION: Multiple radiopaque foreign bodies present, likely either within small bowel or transverse colon. Suspected portion of needle in left mid abdomen, likely in jejunum. No bowel obstruction or free air evident. Nasogastric tube tip and side port in  stomach. Lung bases clear. Electronically Signed   By: Lowella Grip III M.D.   On: 03/05/2020 08:40   ECHOCARDIOGRAM COMPLETE  Result Date: 03/03/2020    ECHOCARDIOGRAM REPORT   Patient Name:   KALETH KOY Date of Exam: 03/03/2020 Medical Rec #:  [id number]        Height:       70.0 in Accession #:    [id number]       Weight:       189.4 lb Date of Birth:  [birth year]        BSA:          2.039 m Patient Age:    89 years         BP:           80/56 mmHg Patient Gender: M                HR:           58 bpm. Exam Location:  [facility] Procedure: 2D Echo, Intracardiac Opacification Agent, Cardiac Doppler and Color            Doppler Indications:     ABN ECG 794.31  History:         Patient has no prior history of Echocardiogram examinations.  Sonographer:     Alyse Low Roar Referring Phys:  [id number] Bradly Bienenstock Diagnosing Phys: Ida Rogue MD IMPRESSIONS  1. Left ventricular ejection fraction, by estimation, is 60 to 65%. The left ventricle has normal function. The left ventricle has no regional wall motion abnormalities. There is moderate left ventricular hypertrophy. Left ventricular diastolic parameters were normal.  2. Right ventricular systolic function was not well visualized. The right ventricular size is normal. There is mildly elevated pulmonary artery systolic pressure. The estimated right ventricular systolic pressure is 58.0 mmHg.  3. The inferior vena cava is dilated in size with <50% respiratory variability, suggesting right atrial pressure of 15 mmHg. FINDINGS  Left Ventricle: Left ventricular ejection fraction, by estimation, is 60 to 65%. The left ventricle has normal function. The left ventricle has no regional wall motion abnormalities. The left ventricular internal cavity size was normal in size. There is  moderate left ventricular hypertrophy. Left ventricular diastolic parameters were normal. Right Ventricle: The right ventricular size is normal. No increase in right ventricular wall  thickness. Right ventricular systolic function was not well visualized. There is mildly elevated pulmonary artery systolic pressure. The tricuspid regurgitant velocity is 2.23 m/s, and with an assumed right atrial pressure of 15 mmHg, the estimated right ventricular systolic pressure is 99.8 mmHg. Left Atrium: Left atrial size was normal in size. Right Atrium: Right atrial size was normal in size. Pericardium: There is no evidence of pericardial effusion. Mitral Valve: The mitral valve is normal in structure. Normal mobility of the mitral valve leaflets. No evidence of mitral valve regurgitation. No evidence of mitral valve stenosis. Tricuspid Valve: The tricuspid valve is normal in structure. Tricuspid valve regurgitation is mild . No evidence of tricuspid stenosis. Aortic Valve: The aortic valve is normal in structure. Aortic valve regurgitation is not visualized. No aortic  stenosis is present. Aortic valve mean gradient measures 2.0 mmHg. Aortic valve peak gradient measures 2.7 mmHg. Aortic valve area, by VTI measures 2.85 cm. Pulmonic Valve: The pulmonic valve was normal in structure. Pulmonic valve regurgitation is not visualized. No evidence of pulmonic stenosis. Aorta: The aortic root is normal in size and structure. Venous: The inferior vena cava is dilated in size with less than 50% respiratory variability, suggesting right atrial pressure of 15 mmHg. IAS/Shunts: No atrial level shunt detected by color flow Doppler.  LEFT VENTRICLE PLAX 2D LVIDd:         4.32 cm  Diastology LVIDs:         3.20 cm  LV e' lateral:   6.53 cm/s LV PW:         1.35 cm  LV E/e' lateral: 8.1 LV IVS:        1.46 cm  LV e' medial:    8.27 cm/s LVOT diam:     2.00 cm  LV E/e' medial:  6.4 LV SV:         39 LV SV Index:   19 LVOT Area:     3.14 cm  RIGHT VENTRICLE RV Mid diam:    2.72 cm RV S prime:     7.40 cm/s LEFT ATRIUM           Index       RIGHT ATRIUM          Index LA diam:      3.70 cm 1.81 cm/m  RA Area:     9.20 cm LA  Vol (A4C): 28.3 ml 13.88 ml/m RA Volume:   17.10 ml 8.38 ml/m  AORTIC VALVE                   PULMONIC VALVE AV Area (Vmax):    2.91 cm    PV Vmax:        0.68 m/s AV Area (Vmean):   3.07 cm    PV Peak grad:   1.8 mmHg AV Area (VTI):     2.85 cm    RVOT Peak grad: 1 mmHg AV Vmax:           82.70 cm/s AV Vmean:          58.000 cm/s AV VTI:            0.138 m AV Peak Grad:      2.7 mmHg AV Mean Grad:      2.0 mmHg LVOT Vmax:         76.60 cm/s LVOT Vmean:        56.700 cm/s LVOT VTI:          0.125 m LVOT/AV VTI ratio: 0.91  AORTA Ao Root diam: 2.60 cm MITRAL VALVE               TRICUSPID VALVE MV Area (PHT): 3.42 cm    TR Peak grad:   19.9 mmHg MV Decel Time: 222 msec    TR Vmax:        223.00 cm/s MV E velocity: 52.80 cm/s MV A velocity: 28.10 cm/s  SHUNTS MV E/A ratio:  1.88        Systemic VTI:  0.12 m                            Systemic Diam: 2.00 cm Ida Rogue MD Electronically signed by Ida Rogue MD Signature Date/Time: 03/03/2020/5:10:30 PM  Final     Consults: Treatment Team:  Alexis Goodell, MD   Subjective:    Overnight Issues: Seizures x2 grand mal last p.m. after episode of agitation.  Concern for metallic object on KUB previously.  Unasyn started due to  dense infiltrate on left base.  Objective:  Vital signs for last 24 hours: Temp:  [98.3 F (36.8 C)-100.7 F (38.2 C)] 99.1 F (37.3 C) (04/04 0400) Pulse Rate:  [65-92] 71 (04/03 1830) Resp:  [20-22] 22 (04/03 1830) BP: (124-167)/(77-101) 135/86 (04/03 1830) SpO2:  [91 %-100 %] 99 % (04/03 1830) FiO2 (%):  [21 %-30 %] 30 % (04/04 0746)  Hemodynamic parameters for last 24 hours:    Intake/Output from previous day: 04/03 0701 - 04/04 0700 In: 1844.9 [I.V.:1779.9; NG/GT:65] Out: 875 [Urine:875]  Intake/Output this shift: Total I/O In: 2508 [I.V.:2208; IV Piggyback:300] Out: -   Vent settings for last 24 hours: Vent Mode: PRVC FiO2 (%):  [21 %-30 %] 30 % Set Rate:  [20 bmp] 20 bmp Vt Set:  [450 mL] 450  mL PEEP:  [5 cmH20] 5 cmH20 Plateau Pressure:  [12 cmH20-18 cmH20] 12 cmH20  Physical Exam:  General:   Sedated, mechanically ventilated, synchronous with vent. Neuro:  Sedated, withdraws from pain, pupils PERRLA, no assessment can be done due to sedation HEENT:  Atraumatic, normocephalic, neck supple, no JVD Cardiovascular:  Regular rate & rhythm, s1s2, no M/R/G, 2+ pulses throughout Lungs:  Good air entry bilaterally, rhonchi left lower lobe, vent assisted, compliant with vent Abdomen:  Soft, nontender, nondistended, no guarding, BS+ x4 Musculoskeletal:  Normal bulk and tone, no deformities, no edema Skin:  Warm and dry.  No obvious rashes, lesions, or ulcerations multiple tattoos throughout body  Assessment/Plan:  Acute hypoxic respiratory failure due to polysubstance overdose Intubated for airway protection due to drug overdose Full vent support Wean FiO2 and PEEP as tolerated Agitation has been imitation to weaning Follow intermittent chest x-ray and ABG as needed Chest x-ray today showsVAP protocol Left lower lobe infiltrate noted, likely aspiration pneumonia from admission Unasyn for aspiration pneumonia Spontaneous breathing trials when respiratory parameters met and mental status permits  Drug overdose, unsure if intentional vs. Unintentional Urine drug screen is positive for cocaine and tricyclics Ethyl alcohol < 10 Salicylates & Acetaminophen: Undetectable levels Continuous cardiac monitoring, monitor QT interval 2D echocardiogram: No major abnormalities, normal LVEF 65% moderate LVH.  Mildly elevated pulmonary artery pressure  IV fluids Will need psych consult once extubated IVC status: Involuntary  Acute metabolic encephalopathy secondary to drug overdose CT head negative 4/2 Maintain RASS goal -1 to -2 Fentanyl and propofol drips to maintain RASS goal Daily wake up assessment Provide supportive care Will likely need inpatient psychiatric  management  Foreign body ingestion CT abdomen and pelvis noncontrasted showed multiple foreign objects General surgical evaluation by Dr. 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 Doy Mince, appreciate input And will need EEG with long-term EEG monitoring recommended transfer to [facility] We will proceed with transfer arrangements to [facility] Checking electrolytes particularly magnesium and phosphorus, correct as needed  Urinary retention Scan of the abdomen showed significant bladder distention Foley catheter placed    LOS: 2 days   Additional comments:  Critical Care Total Time*: 40 Minutes  C. Derrill Kay, MD [facility] 03/05/2020  *  Care during the described time interval was provided by me and/or other providers on the critical care team.  I have reviewed this patient's available data, including medical history, events of note, physical examination and test results as part of my evaluation.  **This note was dictated using voice recognition software/Dragon.  Despite best efforts to proofread, errors can occur which can change the meaning.  Any change was purely unintentional.

## 2020-03-05 NOTE — Progress Notes (Signed)
Pharmacy Antibiotic Note  Jared Kramer is a 39 y.o. male admitted on 4/2 with drug OD and transferred to [facility] 4/4. Pharmacy asked to continue Unasyn which began this morning at [facility].  Plan: -Continue Unasyn 3g IV q6h     Temp (24hrs), Avg:99.5 F (37.5 C), Min:98.6 F (37 C), Max:100.7 F (38.2 C)  Recent Labs  Lab 03/02/20 2254 03/03/20 0705 03/03/20 1308 03/05/20 0411  WBC 8.4 5.2  --  16.3*  CREATININE 1.02 1.10 1.08 1.17    Estimated Creatinine Clearance: 88.4 mL/min (by C-G formula based on SCr of 1.17 mg/dL).    Not on File  Antimicrobials this admission: Unasyn 4/4 >>   Microbiology results: 4/2 MRSA PCR: negative 4/4 Tracheal Aspirate: pending  Thank you for allowing pharmacy to be a part of this patient's care.  Fredonia Highland, PharmD, BCPS Clinical Pharmacist [phone] Please check AMION for all [facility] numbers 03/05/2020

## 2020-03-05 NOTE — Progress Notes (Signed)
Pt transported to and from CT on ventilator with RN and PCT without of incident

## 2020-03-05 NOTE — Consult Note (Addendum)
NEURO HOSPITALIST CONSULT NOTE   Requestig physician: Dr. Loanne Drilling  Reason for Consult: New onset GTC seizures in the setting of drug overdose  History obtained from:  Chart review HPI:                                                                                                                                          ERIVERTO Kramer is a 39 y.o. male with a PMHx of HTN, opioid use disorder, Hep C, foreign body ingestion (multiple times, batteries, razors), mixed personality disorder and factitious disorder who presented to [facility] on 4/1 after a drug overdose. He was intubated for airway protection. On 4/4 he had 2 grand mal seizures. He was seen by neurology at [facility]. He was given versed and propofol. and was transferred to [facility] for LTM.   On arrival to 4N patient was given Ativan for twitching which then subsided before neuro team arrival. Of note, the patient has been to [place], [facility] and [facility] med for foreign body removals in the past.  Hospital course: 4/1 intubated. UDS+ for cocaine, TCA 4/4 Two grand mal seizures, tx to [facility], CT abdomen pelvis: left lower lobe pneumonia   No past medical history on file.  Past Surgical History:  Procedure Laterality Date  . FOREIGN BODY REMOVAL  10/19/2019    Family History  Problem Relation Age of Onset  . Cancer Mother   . Hepatitis Father               Social History:  reports current alcohol use. He reports current drug use. No history on file for tobacco.  Allergies  Allergen Reactions  . Ketamine Other (See Comments)    Laryngospasm October 2020 Ketamine 79m/kg IM given  . Vancomycin Shortness Of Breath and Palpitations    Stops breathing   . Fish-Derived Products Rash  . Carbamazepine   . Haloperidol Lactate Other (See Comments)    Dystonia (per pt)  . Famotidine Swelling, Rash and Other (See Comments)    IV Swell up arm   . Fentanyl Nausea And Vomiting  . Ibuprofen Other (See Comments)    Bleeding;  tolerates ketorolac   . Povidone-Iodine Rash    MEDICATIONS:  Scheduled: . chlorhexidine gluconate (MEDLINE KIT)  15 mL Mouth Rinse BID  . enoxaparin (LOVENOX) injection  40 mg Subcutaneous Q24H  . famotidine  20 mg Per Tube BID  . folic acid  1 mg Per Tube Daily  . mouth rinse  15 mL Mouth Rinse 10 times per day  . thiamine  100 mg Per Tube Daily   Continuous: . sodium chloride 75 mL/hr (03/05/20 1704)  . dexmedetomidine (PRECEDEX) IV infusion 0.5 mcg/kg/hr (03/05/20 1712)  . fentaNYL infusion INTRAVENOUS 150 mcg/hr (03/05/20 1621)  . levETIRAcetam 1,000 mg (03/05/20 1647)  . magnesium sulfate bolus IVPB 2 g (03/05/20 1710)   OZH:YQMVHQIONGEXB, LORazepam, ondansetron (ZOFRAN) IV   ROS:                                                                                                                                       History unobtainable from patient due to mental status   Blood pressure 104/62, pulse 87, resp. rate 18, SpO2 100 %.   General Examination:                                                                                                       Physical Exam  HEENT-  Normocephalic, no lesions, without obvious abnormality.  Normal external eye and conjunctiva.   Cardiovascular- S1-S2 audible, pulses palpable throughout   Lungs-no rhonchi or wheezing noted, no excessive working breathing.  Saturations within normal limits intubated Abdomen- All 4 quadrants palpated and nontender Extremities- Warm, dry and intact Musculoskeletal-no joint tenderness, deformity or swelling Skin-warm and dry, no hyperpigmentation, vitiligo, or suspicious lesions  Mental Status: Patient weaning off propofol and on Precedex and fentanyl during exam. Patient does not respond to verbal stimuli.  Does not respond to deep sternal rub.  Does not follow commands.  No  verbalizations noted.  Cranial Nerves: II: patient does not respond confrontation bilaterally,  III,IV,VI:pupils right 3 mm, left 3 mm,and reactive bilaterally, doll's response absent bilaterally.  V,VII: corneal reflex present bilaterally  VIII: patient does not respond to verbal stimuli IX,X: gag reflex present, XI: trapezius strength unable to test bilaterally XII: tongue strength unable to test Motor: Extremities flaccid throughout.  No spontaneous movement noted.  No purposeful movements noted. Sensory: Does not respond to noxious stimuli in any extremity. Deep Tendon Reflexes:  2+ patellars bilaterally  Plantars: absent bilaterally Cerebellar: Unable to perform Gait: Unable to assess   Lab Results: Basic Metabolic Panel: Recent Labs  Lab 03/02/20 2254  03/02/20 2254 03/03/20 0705 03/03/20 1308 03/05/20 0411 03/05/20 1620  NA 137  --  143 143 143 143  K 3.9  --  4.0 3.9 3.4* 3.5  CL 105  --  111 112* 113*  --   CO2 21*  --  25 27 25   --   GLUCOSE 104*  --  91 88 113*  --   BUN 13  --  14 14 12   --   CREATININE 1.02  --  1.10 1.08 1.17  --   CALCIUM 9.2   < > 8.6* 8.6* 8.3*  --   MG  --   --   --   --  1.8  --   PHOS  --   --   --   --  3.8  --    < > = values in this interval not displayed.    CBC: Recent Labs  Lab 03/02/20 2254 03/03/20 0705 03/05/20 0411 03/05/20 1620  WBC 8.4 5.2 16.3*  --   NEUTROABS 6.1  --   --   --   HGB 14.3 13.0 12.7* 11.2*  HCT 44.9 41.0 41.3 33.0*  MCV 87.0 88.0 89.6  --   PLT 206 PLATELET CLUMPS NOTED ON SMEAR, UNABLE TO ESTIMATE 146*  --     Cardiac Enzymes: Recent Labs  Lab 03/03/20 0100  CKTOTAL 56    Lipid Panel: Recent Labs  Lab 03/05/20 0411  TRIG 108    Imaging: CT ABDOMEN PELVIS WO CONTRAST  Result Date: 03/05/2020 CLINICAL DATA:  Ingestion of foreign body EXAM: CT ABDOMEN AND PELVIS WITHOUT CONTRAST TECHNIQUE: Multidetector CT imaging of the abdomen and pelvis was performed following the standard protocol  without IV contrast. COMPARISON:  12/29/2019 FINDINGS: Lower chest: Airspace disease in both lower lobes with dense consolidation on the left where there is some volume loss. Hepatobiliary: No focal liver abnormality.No evidence of biliary obstruction or stone. Pancreas: Unremarkable. Spleen: Unremarkable. Adrenals/Urinary Tract: Negative adrenals. Mild bilateral hydroureteronephrosis is due to an over distended urinary bladder which reaches the umbilicus. Stomach/Bowel: Linear metallic foreign body associated with the ventral stomach wall at the level of the body. There is an extraluminal linear metallic body at the level of the pylorus. Both of these are unchanged not significantly changed from before. Metallic densities associated with the ileocolic mesentery and ileocecal region. There has likely been enterocolonic anastomosis in the right lower quadrant. No new foreign body is seen. An enteric tube tip reaches the proximal stomach. Vascular/Lymphatic: No acute vascular abnormality. No mass or adenopathy. Reproductive:Negative Other: No ascites or pneumoperitoneum. There are multiple metallic foreign bodies in the subcutaneous ventral abdominal wall that are not significantly changed. Musculoskeletal: No acute abnormalities. These results will be called to the ordering clinician or representative by the Radiologist Assistant, and communication documented in the PACS or [employer]. IMPRESSION: 1. Bilateral lower lobe pneumonia with dense consolidation on the left. The pattern suggests aspiration. 2. Over distended bladder leading to bilateral hydroureteronephrosis. 3. Multiple intra-abdominal and subcutaneous metallic foreign bodies without significant change from January 2021. No visible bowel perforation or obstruction. Electronically Signed   By: Monte Fantasia M.D.   On: 03/05/2020 10:05   DG Abd 1 View  Result Date: 03/05/2020 CLINICAL DATA:  NG tube placement EXAM: ABDOMEN - 1 VIEW COMPARISON:   None. FINDINGS: The enteric tube tip projects over the gastric body. The side hole projects near the GE junction. Again noted is a linear radiopaque foreign body projecting over the  right upper quadrant. The bowel gas pattern is nonspecific and nonobstructive. Old healed right-sided rib fractures are noted. IMPRESSION: Enteric tube tip projects over the gastric body. The side hole projects near the GE junction. Again noted is a linear foreign body projecting over the right upper quadrant. Electronically Signed   By: Constance Holster M.D.   On: 03/05/2020 02:15   DG Chest Port 1 View  Result Date: 03/05/2020 CLINICAL DATA:  Endotracheal tube and central line placement. EXAM: PORTABLE CHEST 1 VIEW COMPARISON:  Radiograph earlier this day. Lung bases from abdominal CT earlier this day. FINDINGS: Enteric tube tip 4.4 cm from the carina. Enteric tube in place with tip and side-port below the diaphragm. Left internal jugular central venous catheter tip projects over the upper SVC. Dense retrocardiac opacity, as seen on CT earlier today. Minimal patchy opacities in the right infrahilar lung. Unchanged heart size and mediastinal contours. No pneumothorax. IMPRESSION: 1. Endotracheal tube tip 4.4 cm from the carina. Enteric tube and left internal jugular central venous catheter in place. 2. Left retrocardiac opacity consistent with pneumonia, as seen on CT earlier today. Electronically Signed   By: Keith Rake M.D.   On: 03/05/2020 16:22   DG Chest Port 1 View  Result Date: 03/05/2020 CLINICAL DATA:  Check central line placement EXAM: PORTABLE CHEST 1 VIEW COMPARISON:  Film from earlier in the same day. FINDINGS: Endotracheal tube and gastric catheter are again seen and stable. Left jugular central line is noted with the catheter tip in the mid superior vena cava. No pneumothorax is seen. The lungs are well aerated with persistent left basilar opacity. No bony abnormality is seen. IMPRESSION: No pneumothorax  following central line placement on the left. Electronically Signed   By: Inez Catalina M.D.   On: 03/05/2020 03:35   DG Chest Port 1 View  Result Date: 03/05/2020 CLINICAL DATA:  Hypoxia EXAM: PORTABLE CHEST 1 VIEW COMPARISON:  03/02/2020 FINDINGS: Cardiac shadow is within normal limits. Gastric catheter is noted within the stomach although the proximal side port is noted in the distal esophagus. This could be advanced several cm further into the stomach. Endotracheal tube is noted in satisfactory position 5.8 cm above the carina. Lungs are well aerated bilaterally. Slight increase in left basilar infiltrative opacity is seen. IMPRESSION: Tubes and lines as described above. The gastric catheter could be advanced further into the stomach. New left basilar infiltrative opacity. Electronically Signed   By: Inez Catalina M.D.   On: 03/05/2020 02:15   DG Abd Portable 2V  Result Date: 03/05/2020 CLINICAL DATA:  Concern for radiopaque foreign body. Patient reportedly consuming non-food objects EXAM: PORTABLE ABDOMEN - 2 VIEW COMPARISON:  March 05, 2013 study obtained earlier in the day FINDINGS: Supine and erect images obtained. Nasogastric tube tip and side port in body of stomach. There are multiple linear radiopaque foreign bodies scattered throughout the abdomen and upper pelvis as well as a slightly thicker metallic foreign body measuring 2.2 cm, located in the left mid abdomen. There is no bowel dilatation or air-fluid level to suggest bowel obstruction. No free air noted. Lung bases clear. Postoperative change noted in right mid abdomen. IMPRESSION: Multiple radiopaque foreign bodies present, likely either within small bowel or transverse colon. Suspected portion of needle in left mid abdomen, likely in jejunum. No bowel obstruction or free air evident. Nasogastric tube tip and side port in stomach. Lung bases clear. Electronically Signed   By: Lowella Grip III M.D.   On: 03/05/2020  08:40    Assessment:  39 year old male with new onset GTC seizures during his admission at [facility] for management of drug overdose with UDS positive for cocaine and tricyclic antidepressant 1. On exam today patient intubated and still sedated with propofol (weaning off), precedex and fentanyl. Cough and gag present. Did not respond to verbal , noxious or deep sternal rub. PERRL, corneal reflexes present bilaterally, did have patellar reflexes. Mg, phosphorous and Na levels are WNL  2. DDx for AMS includes serotonin syndrome, which can be prolonged in some cases, as well as post-ictal state.   Recommendations: 1. Continue Keppra at 1000 mg IV BID 2. LTM EEG has been ordered. 3. Continue propofol sedation and titrate based on EEG and intubation requirements.   Addendum: -- Intermittent stereotyped shivering movements of BUE were noted at approximately 6:30 PM, with no electrographic correlate on LTM EEG.  -- Propofol is being switched to Versed for possible serotonin syndrome, as well as for control of shivering.  -- Interim LTM EEG report: LTM eeg reviewed till 1830. No seizures seen. EEG showed continuous generalized 2-3hz  delta slowing with overriding 13-15hz  beta activity consistent with severe encephalopathy, non specific to etiology but most likely secondary to sedation. Event button was pushed at [place] for whole body seizure like activity. Concomitant EEG before, during and after the event didn't show any eeg change to suggest seizure and therefore this was NOT an epileptic event.   50 minutes spent in the emergent neurological evaluation and management of this critically ill patient.    Electronically signed: Dr. Kerney Elbe 03/05/2020, 4:47 PM

## 2020-03-05 NOTE — Discharge Summary (Signed)
Physician Discharge Summary  Patient ID: Jared Kramer MRN: [id number] DOB/AGE: [birth year] 39 y.o.  Admit date: 03/02/2020 Discharge date: 03/05/2020  Admission Diagnoses: Unresponsiveness Discharge Diagnoses:  Principal Problem:   Acute respiratory failure ([place]) Active Problems:   Drug overdose   Seizures ([place])   Aspiration pneumonia [facility])   Urinary retention   Discharged Condition: stable  Hospital Course:  JOEVANNI RODDEY is an 39 y.o. male admitted with drug overdose (unsureifintentional vs.unintentional) requiring intubation in the ED for airway protection.Urine drug screen is positive for cocaine and tricyclics. In ICU due to intubation and mechanical ventilation.  He has remained in the intensive care unit on mechanical ventilation limitation to weaning his agitation.  On the day of discharge he had 2 grand mal seizures in the early morning hours.  He has been evaluated by neurology.  He has been loaded with Keppra. Patient will need EEG and ongoing EEG monitoring not available of hours at this facility and he will need to be transferred to [facility] for further neurologic evaluation/intervention.  Of note the patient has a history of self-harm and insertion/ingestion of multiple foreign objects.  This was evident on chest x-ray/KUB performed today.  Abdominal and pelvic CT scan confirms multiple foreign objects either subcutaneous or ingested.  Surgical evaluation by Dr. Peyton Najjar was done.  Dr. Peyton Najjar reviewed CT scans performed at [facility] and these findings are CHRONIC.  There is no evidence of perforation by these objects.  Patient is also has been noted to have a left lower lobe infiltrate this was corroborated by CT scan of the abdomen and pelvis noting bilateral lower lobe pneumonia dense consolidation on the left.  He is on Unasyn for aspiration pneumonia.  CT also showed that patient has significant overdistended bladder with hydroureteronephrosis, Foley catheter  placed.  Patient currently remains sedated due to severe agitation when sedation is lightened.  He is being transferred to [facility] for further neurology input and EEG as noted above.  Patient is in stable condition.  Accepting physician will be Dr. Baltazar Apo.  Consults: Neurology: Alexis Goodell, MD.  Surgery: Lesli Albee, MD  Significant Diagnostic Studies: See record  Treatments: See record  Discharge Exam: Blood pressure 109/64, pulse 93, temperature 99.1 F (37.3 C), resp. rate 16, height 5' 10"  (1.778 m), weight 87.4 kg, SpO2 93 %. General: Sedated, mechanically ventilated, synchronous with vent. Neuro:Sedated, withdraws from pain, pupils PERRLA, no assessment can be done due to sedation HEENT:Atraumatic, normocephalic, neck supple, no JVD Cardiovascular:Regular rate & rhythm, s1s2, no M/R/G, 2+ pulses throughout Lungs: Good air entry bilaterally, rhonchi left lower lobe, vent assisted, compliant with vent Abdomen:Soft, nontender, nondistended, no guarding, BS+ x4 Musculoskeletal:Normal bulk and tone, no deformities, no edema Skin:Warm and dry. No obvious rashes, lesions, or ulcerations multiple tattoos throughout body   Disposition: Discharge disposition: 02-Transferred to [place] as of 03/05/2020   Not on File     Medication List    You have not been prescribed any medications.      Scheduled Meds: . chlorhexidine gluconate (MEDLINE KIT)  15 mL Mouth Rinse BID  . Chlorhexidine Gluconate Cloth  6 each Topical Daily  . enoxaparin (LOVENOX) injection  40 mg Subcutaneous Q24H  . feeding supplement (PRO-STAT SUGAR FREE 64)  30 mL Per Tube QID  . free water  30 mL Per Tube Q4H  . mouth rinse  15 mL Mouth Rinse 10 times per day  .  pantoprazole (PROTONIX) IV  40 mg Intravenous Daily  . potassium chloride  40 mEq Oral BID   Continuous Infusions: . ampicillin-sulbactam (UNASYN) IV 3 g (03/05/20 1126)  .  dexmedetomidine (PRECEDEX) IV infusion Stopped (03/04/20 2035)  . dextrose 125 mL/hr at 03/05/20 0900  . DOPamine Stopped (03/04/20 0709)  . feeding supplement (VITAL AF 1.2 CAL) 1,000 mL (03/05/20 0029)  . fentaNYL infusion INTRAVENOUS 150 mcg/hr (03/05/20 0900)  . lactated ringers 75 mL/hr at 03/05/20 1210  . levETIRAcetam    . propofol (DIPRIVAN) infusion 50 mcg/kg/min (03/05/20 1317)   PRN Meds:.acetaminophen, fentaNYL, ipratropium-albuterol, midazolam, ondansetron (ZOFRAN) IV   Signed: Renold Don, MD [facility] 03/05/2020, 1:35 PM

## 2020-03-05 NOTE — Care Plan (Addendum)
LTM eeg reviewed till 1830. No seizures seen. EEG showed continuous generalized 2-3hz  delta slowing with overriding 13-15hz  beta activity consistent with severe encephalopathy, non specific to etiology but most likely secondary to sedation.   Event button was pushed at 1856 for whole body seizure like activity. Concomitant EEG before, during and after the event didn't show any eeg change to suggest seizure and therefore this was NOT an epileptic event.   Dr Otelia Limes was notified.   Please review final report for details.  Ruxin Ransome Annabelle Harman

## 2020-03-05 NOTE — Progress Notes (Signed)
Pt transferred to [facility] for continuous EEG, report called to Amy, [facility] in house to p/u patient, his family is aware of transfer, VSS on 21% FIO2, pt exhibits some twitching of ABD and upper extrems, RUE elevated on pillows.

## 2020-03-05 NOTE — H&P (Addendum)
NAME:  Jared Kramer, MRN:  [id number], DOB:  [birth year], LOS: 0 ADMISSION DATE:  03/05/2020, CONSULTATION DATE: 03/05/20 REFERRING MD:  EDP, CHIEF COMPLAINT:  Seizures   Brief History   [name] is a 39 y.o male with a history of several foreign body ingestions (batteries and razors), mixed personality disorder, and factious disorder who presented to [facility] on 4/1 after a drug overdose (UDS + cocaine and TCA). He was subsequently intubated for airway protection. On 4/4 he experience 2 grand-mal seizures and was transferred to [facility] for prolonged EEG.   History of present illness   Jared Kramer is a 39 y.o male who presented to [facility] on 4/1 after a suspected drug overdose (unclear if this was intentional or unintentional). Patient is currently intubated and sedated; therefore, histroy was primarily obtained through chart review. Patient was brought to [facility] on 4.1 after being found unresponsive in a gas station bathroom with several empty bottles next to him. Upon arrival to the ED he was encephalopathic with a GCS of 8 and several periods of apnea. He was subsequently intubated for airway protection. Work-up on admission was significant for a UDS + cocaine and TCAs. Over the course of the next 48 hours he had several episodes of agitation and failure to wean from the vent. On the morning of 4/4 he had 2 episodes of what appearred to be grand-mal seizures. CXR was significant for a new LLL infiltrate. Neurology was consulted, the patient was started on keppra and broad spectrum antibiotics before being transferred to [facility] for prolonged EEG.   Past Medical History  Factious Disorder  Mixed personality disorder  Foreign body ingestion (razors, batteries) Polysubstance use disorder (Benzos, opiates) Hepatitis C HTN  Significant Hospital Events   4/1: Intubated in ED for airway protection 4/2: Admitted to ICU at [facility]  4/4: 2 grand mal seizures and transferred to [facility]  Consults:  Neurology    Procedures:  4/1: Intubation   Significant Diagnostic Tests:  4/1: Chest x-ray>>Endotracheal tube terminates 4 cm above the carina. Lungs are clear.  No pleural effusion or pneumothorax.The heart is normal in size.Old right posterior rib fracture deformities.Enteric tube courses into the stomach. 4/1: Abdominal 1 view x-ray>>Enteric tube terminates in the proximal gastric body. Linear radiopaque foreign body overlies the right upper abdomen 4/2: CT head without contrast>>No acute intracranial abnormality noted. Minimal air-fluid level within the left maxillary antrum. 4/4: CXR>> New left basilar infiltrative opacity. 4/4: DG Abd 2 view>> Multiple radiopaque foreign bodies present, likely either within small bowel or transverse colon. Suspected portion of needle in left mid abdomen, likely in jejunum. No bowel obstruction or free air evident. Nasogastric tube tip and side port in stomach. Lung bases clear. 4/4 CT abd/pelvis>> Bilateral lower lobe pneumonia with dense consolidation on the left. The pattern suggests aspiration. Over distended bladder leading to bilateral Hydroureteronephrosis. Multiple intra-abdominal and subcutaneous metallic foreign bodies without significant change from January 2021. No visible bowel perforation or obstruction.  Micro Data:  SARS-CoV-2 PCR 4/1>> negative Influenza PCR 4/1>> negative Respiratory cultures 4/4 >> pending   Antimicrobials:  Unasyn 4/4>> current  Interim history/subjective:  See above  Objective   Blood pressure 104/62, pulse 87, resp. rate 18, SpO2 100 %.    Vent Mode: PRVC FiO2 (%):  [21 %-40 %] 40 % Set Rate:  [20 bmp] 20 bmp Vt Set:  [450 mL-4503 mL] 580 mL PEEP:  [5 cmH20] 5 cmH20 Plateau Pressure:  [12 cmH20-18 cmH20] 12 cmH20  No  intake or output data in the 24 hours ending 03/05/20 1628 There were no vitals filed for this visit.  Examination: General: Well nourished male, sedated HENT: Normocephalic, atraumatic, moist  mucus membranes Pulm: Good air movement with no wheezing or crackles  CV: RRR, no murmurs, no rubs  Abdomen: Active bowel sounds, soft, non-distended, no tenderness to palpation  Extremities: Pulses palpable in all extremities, no LE edema  Skin: Warm and diaphoretic Neuro: Sedated, sluggish pupillary responsive,  Resolved Hospital Problem list   N/A  Assessment & Plan:  Acute hypoxic respiratory failure due to polysubstance overdose Intubated for airway protection due to drug overdose - Full vent support - Wean FiO2 and PEEP as tolerated - Sedation with Fentanyl and Propofol, wean as tolerated - RASS goal -1 to -2  - SBT once sedation is weaned  - Has been on MV for >48 hours without nutrition. Would start tube feed with free water replacement.   Lower lobe airspace disease seen on CT - Possible aspiration event prior to admission. No evidence of active infection - Hold on antibiotics for now - Follow-up respiratory cultures  Acute metabolic encephalopathy secondary to drug overdose Unsure if intentional vs. Unintentional - Urine drug screen is positive for cocaine and tricyclics - Ethyl alcohol < 10 - Salicylates & Acetaminophen: Undetectable levels - Monitor QTc closely in setting of TCA overdose  - Will need psych consult  - Currently involuntarily committed     Seizure like activity  - Started on 4/4  - Loaded with keppra and started on 1000mg  BID  - Prolonged EEG ordered - Neurology consulted. Appreciate their recs.  - Check LA   Foreign body ingestion - CT abdomen and pelvis noncontrasted showed multiple foreign objects - General surgery consulted at [facility] reviewed outside records and felt that these foreign bodies were stable from prior episodes and sutures. Recommended against surgical intervention at this point.    Urinary retention - CT Abd/Pelvis with severally distended bladder.  - Possible secondary to medications  - Maintain foley   Best practice:    Diet: NPO Pain/Anxiety/Delirium protocol (if indicated): Propofol and Fentanyl  VAP protocol (if indicated): Per order set DVT prophylaxis: Lovenox GI prophylaxis: Famotidine Glucose control: CBG and SSI every 4 hours  Mobility: Bed rest Code Status: Full Family Communication: Will attempt to contact brother Disposition: ICU  Labs   CBC: Recent Labs  Lab 03/02/20 2254 03/03/20 0705 03/05/20 0411 03/05/20 1620  WBC 8.4 5.2 16.3*  --   NEUTROABS 6.1  --   --   --   HGB 14.3 13.0 12.7* 11.2*  HCT 44.9 41.0 41.3 33.0*  MCV 87.0 88.0 89.6  --   PLT 206 PLATELET CLUMPS NOTED ON SMEAR, UNABLE TO ESTIMATE 146*  --     Basic Metabolic Panel: Recent Labs  Lab 03/02/20 2254 03/03/20 0705 03/03/20 1308 03/05/20 0411 03/05/20 1620  NA 137 143 143 143 143  K 3.9 4.0 3.9 3.4* 3.5  CL 105 111 112* 113*  --   CO2 21* 25 27 25   --   GLUCOSE 104* 91 88 113*  --   BUN 13 14 14 12   --   CREATININE 1.02 1.10 1.08 1.17  --   CALCIUM 9.2 8.6* 8.6* 8.3*  --   MG  --   --   --  1.8  --   PHOS  --   --   --  3.8  --    GFR: Estimated Creatinine Clearance: 88.4  mL/min (by C-G formula based on SCr of 1.17 mg/dL). Recent Labs  Lab 03/02/20 2254 03/03/20 0705 03/05/20 0411  PROCALCITON  --   --  0.14  WBC 8.4 5.2 16.3*    Liver Function Tests: No results for input(s): AST, ALT, ALKPHOS, BILITOT, PROT, ALBUMIN in the last 168 hours. No results for input(s): LIPASE, AMYLASE in the last 168 hours. No results for input(s): AMMONIA in the last 168 hours.  ABG    Component Value Date/Time   PHART 7.340 (L) 03/05/2020 1620   PCO2ART 45.2 03/05/2020 1620   PO2ART 119.0 (H) 03/05/2020 1620   HCO3 24.2 03/05/2020 1620   TCO2 26 03/05/2020 1620   ACIDBASEDEF 2.0 03/05/2020 1620   O2SAT 98.0 03/05/2020 1620     Coagulation Profile: No results for input(s): INR, PROTIME in the last 168 hours.  Cardiac Enzymes: Recent Labs  Lab 03/03/20 0100  CKTOTAL 56    HbA1C: No results  found for: HGBA1C  CBG: Recent Labs  Lab 03/04/20 2344 03/05/20 0151 03/05/20 0535 03/05/20 0726 03/05/20 1200  GLUCAP 55* 55* 104* 85 99    Review of Systems:   Unable to obtain due to critical illness.   Past Medical History  He,  has no past medical history on file.   Surgical History   No past surgical history on file.   Social History   reports current alcohol use. He reports current drug use.   Family History   His family history is not on file.   Allergies Not on File   Home Medications  Prior to Admission medications   Not on File       Levora Dredge, MD IMTS PGY3  Pager: [phone]

## 2020-03-05 NOTE — Care Plan (Deleted)
Event button was pushed at 1856 for whole body seizure like activity. Concomitant EEG before, during and after the event didn't show any eeg change to suggest seizure and therefore this was NOT an epileptic event.   Dr Otelia Limes was notified.   Jared Kramer Jared Kramer

## 2020-03-05 NOTE — Progress Notes (Signed)
Follow seizure event, pt lost PIV, and now only has 1 reliable working PIV.  Will place central line emergently for IV access.     Harlon Ditty, AGACNP-BC [facility] Pager: [phone]

## 2020-03-05 NOTE — Progress Notes (Addendum)
LTM EEG hooked up and running - no initial skin breakdown - push button tested - neuro notified.  

## 2020-03-05 NOTE — Progress Notes (Signed)
Pt woke up severely agitated, almost got out of bed.  Was placed back in bed by nursing, then pt exhibiting GRAND MAL SEIZURE, forcefully biting ETT and with violent generalized muscle contractions.  With biting of ETT, pt noted to desaturate to the 50's.  Pt was given 4 mg versed IV push, and propofol and fentanyl drips increased.  Pt was taken off ventilator and manually bagged with improvement of O2 saturations to 90's.  Pt placed back on vent, and able to wean down to 5 PEEP and 45% FiO2.  Will place on IV Keppra, obtain EEG with Neurology consult.     Harlon Ditty, AGACNP-BC [facility] Pager: [phone]

## 2020-03-05 NOTE — Progress Notes (Signed)
Pharmacy Antibiotic Note  Jared Kramer is a 39 y.o. male admitted on 03/02/2020 with pneumonia.  Pharmacy has been consulted for Unasyn dosing.  Plan: Unasyn 3gm IV q6hrs  Height: 5\' 10"  (177.8 cm) Weight: 87.4 kg (192 lb 10.9 oz) IBW/kg (Calculated) : 73  Temp (24hrs), Avg:98.7 F (37.1 C), Min:98.3 F (36.8 C), Max:99.5 F (37.5 C)  Recent Labs  Lab 03/02/20 2254 03/03/20 0705 03/03/20 1308  WBC 8.4 5.2  --   CREATININE 1.02 1.10 1.08    Estimated Creatinine Clearance: 95.8 mL/min (by C-G formula based on SCr of 1.08 mg/dL).    Not on File  Antimicrobials this admission:   >>    >>   Dose adjustments this admission:   Microbiology results:  BCx:   UCx:    Sputum:    MRSA PCR:   Thank you for allowing pharmacy to be a part of this patient's care.  05/03/20 A 03/05/2020 3:35 AM

## 2020-03-05 NOTE — Consult Note (Addendum)
Reason for Consult:Seizure Requesting Physician: Patsey Berthold  CC: Seizure  I have been asked by Dr. Patsey Berthold to see this patient in consultation for seizure.  HPI: Jared Kramer is an 39 y.o. male who is unable to provide any history due to being intubated and sedated, therefore all history obtained from the chart.  Patient was found unresponsive in a [employer] station bathroom with an empty pill bottle beside him.  Patient was brought to the ED where he required intubation.  Overnight patient became agitated and was noted to have a generalized tonic-clonic seizure.  Patient given Versed and Propofol/Versed were increased.    Past medical history: HTN, Hepatitis C, mixed personality disorder, episodic benzodiazepine dependence, opiate abuse  Surgical history: foreign body removal  Family history: Father with hepatitis, mother with cancer  Social History:  reports current alcohol use. He reports current drug use. No history on file for tobacco.  Not on File  Medications:  I have reviewed the patient's current medications. Prior to Admission:  No medications prior to admission.   Scheduled: . chlorhexidine gluconate (MEDLINE KIT)  15 mL Mouth Rinse BID  . Chlorhexidine Gluconate Cloth  6 each Topical Daily  . enoxaparin (LOVENOX) injection  40 mg Subcutaneous Q24H  . feeding supplement (PRO-STAT SUGAR FREE 64)  30 mL Per Tube QID  . free water  30 mL Per Tube Q4H  . mouth rinse  15 mL Mouth Rinse 10 times per day  . pantoprazole (PROTONIX) IV  40 mg Intravenous Daily  . potassium chloride  40 mEq Oral BID    ROS: Unable to provide due to intubation and sedation  Physical Examination: Blood pressure 135/86, pulse 71, temperature 99.1 F (37.3 C), resp. rate (!) 22, height _0  (1.778 m), weight 87.4 kg, SpO2 99 %.  HEENT-  Normocephalic, no lesions, without obvious abnormality.  Normal external eye and conjunctiva.  Normal TM's bilaterally.  Normal auditory canals and external  ears. Normal external nose, mucus membranes and septum.  Normal pharynx. Cardiovascular- S1, S2 normal, pulses palpable throughout   Lungs- chest clear, no wheezing, rales, normal symmetric air entry Abdomen- soft, non-tender; bowel sounds normal; no masses,  no organomegaly Extremities- no edema Lymph-no adenopathy palpable Musculoskeletal-no joint tenderness, deformity or swelling Skin-warm and dry, no hyperpigmentation, vitiligo, or suspicious lesions  Neurological Examination   Mental Status: Patient does not respond to verbal stimuli.  Does not respond to deep sternal rub.  Does not follow commands.  No verbalizations noted.  Cranial Nerves: II: patient does not respond confrontation bilaterally, pupils right 3 mm, left 3 mm,and reactive bilaterally III,IV,VI: Oculocephalic response absent bilaterally.  V,VII: corneal reflex absent bilaterally  VIII: patient does not respond to verbal stimuli IX,X: gag reflex reduced, XI: trapezius strength unable to test bilaterally XII: tongue strength unable to test Motor: Extremities flaccid throughout.  No spontaneous movement noted.  No purposeful movements noted. Sensory: Does not respond to noxious stimuli in any extremity. Deep Tendon Reflexes:  Absent throughout. Plantars: absent bilaterally Cerebellar: Unable to perform  Laboratory Studies:   Basic Metabolic Panel: Recent Labs  Lab 03/02/20 2254 03/02/20 2254 03/03/20 0705 03/03/20 1308 03/05/20 0411  NA 137  --  143 143 143  K 3.9  --  4.0 3.9 3.4*  CL 105  --  111 112* 113*  CO2 21*  --  _1 GLUCOSE 104*  --  91 88 113*  BUN 13  --  _2 CREATININE 1.02  --  1.10 1.08 1.17  CALCIUM 9.2   < > 8.6* 8.6* 8.3*   < > = values in this interval not displayed.    Liver Function Tests: No results for input(s): AST, ALT, ALKPHOS, BILITOT, PROT, ALBUMIN in the last 168 hours. No results for input(s): LIPASE, AMYLASE in the last 168 hours. No results for input(s):  AMMONIA in the last 168 hours.  CBC: Recent Labs  Lab 03/02/20 2254 03/03/20 0705 03/05/20 0411  WBC 8.4 5.2 16.3*  NEUTROABS 6.1  --   --   HGB 14.3 13.0 12.7*  HCT 44.9 41.0 41.3  MCV 87.0 88.0 89.6  PLT 206 PLATELET CLUMPS NOTED ON SMEAR, UNABLE TO ESTIMATE 146*    Cardiac Enzymes: Recent Labs  Lab 03/03/20 0100  CKTOTAL 56    BNP: Invalid input(s): POCBNP  CBG: Recent Labs  Lab 03/04/20 2144 03/04/20 2344 03/05/20 0151 03/05/20 0535 03/05/20 0726  GLUCAP 68* 55* 55* 104* 85    Microbiology: Results for orders placed or performed during the hospital encounter of 03/02/20  SARS CORONAVIRUS 2 (TAT 6-24 HRS) Nasopharyngeal Nasopharyngeal Swab     Status: None   Collection Time: 03/02/20 10:55 PM   Specimen: Nasopharyngeal Swab  Result Value Ref Range Status   SARS Coronavirus 2 NEGATIVE NEGATIVE Final    Comment: (NOTE) SARS-CoV-2 target nucleic acids are NOT DETECTED. The SARS-CoV-2 RNA is generally detectable in upper and lower respiratory specimens during the acute phase of infection. Negative results do not preclude SARS-CoV-2 infection, do not rule out co-infections with other pathogens, and should not be used as the sole basis for treatment or other patient management decisions. Negative results must be combined with clinical observations, patient history, and epidemiological information. The expected result is Negative. Fact Sheet for Patients: SugarRoll.be Fact Sheet for Healthcare Providers: https://www.woods-mathews.com/ This test is not yet approved or cleared by the [place] FDA and  has been authorized for detection and/or diagnosis of SARS-CoV-2 by FDA under an Emergency Use Authorization (EUA). This EUA will remain  in effect (meaning this test can be used) for the duration of the COVID-19 declaration under Section 56 4(b)(1) of the Act, 21 U.S.C. section 360bbb-3(b)(1), unless the authorization  is terminated or revoked sooner. Performed at [facility], [place] [address], [place], [place] [address]   Respiratory Panel by RT PCR (Flu A&B, Covid) - Nasopharyngeal Swab     Status: None   Collection Time: 03/03/20  3:15 AM   Specimen: Nasopharyngeal Swab  Result Value Ref Range Status   SARS Coronavirus 2 by RT PCR NEGATIVE NEGATIVE Final    Comment: (NOTE) SARS-CoV-2 target nucleic acids are NOT DETECTED. The SARS-CoV-2 RNA is generally detectable in upper respiratoy specimens during the acute phase of infection. The lowest concentration of SARS-CoV-2 viral copies this assay can detect is 131 copies/mL. A negative result does not preclude SARS-Cov-2 infection and should not be used as the sole basis for treatment or other patient management decisions. A negative result may occur with  improper specimen collection/handling, submission of specimen other than nasopharyngeal swab, presence of viral mutation(s) within the areas targeted by this assay, and inadequate number of viral copies (<131 copies/mL). A negative result must be combined with clinical observations, patient history, and epidemiological information. The expected result is Negative. Fact Sheet for Patients:  PinkCheek.be Fact Sheet for Healthcare Providers:  GravelBags.it This test is not yet ap proved or cleared by the [place] and  has been authorized for  detection and/or diagnosis of SARS-CoV-2 by FDA under an Emergency Use Authorization (EUA). This EUA will remain  in effect (meaning this test can be used) for the duration of the COVID-19 declaration under Section 564(b)(1) of the Act, 21 U.S.C. section 360bbb-3(b)(1), unless the authorization is terminated or revoked sooner.    Influenza A by PCR NEGATIVE NEGATIVE Final   Influenza B by PCR NEGATIVE NEGATIVE Final    Comment: (NOTE) The Xpert Xpress SARS-CoV-2/FLU/RSV assay is intended  as an aid in  the diagnosis of influenza from Nasopharyngeal swab specimens and  should not be used as a sole basis for treatment. Nasal washings and  aspirates are unacceptable for Xpert Xpress SARS-CoV-2/FLU/RSV  testing. Fact Sheet for Patients: PinkCheek.be Fact Sheet for Healthcare Providers: GravelBags.it This test is not yet approved or cleared by the [place] FDA and  has been authorized for detection and/or diagnosis of SARS-CoV-2 by  FDA under an Emergency Use Authorization (EUA). This EUA will remain  in effect (meaning this test can be used) for the duration of the  Covid-19 declaration under Section 564(b)(1) of the Act, 21  U.S.C. section 360bbb-3(b)(1), unless the authorization is  terminated or revoked. Performed at [facility], [address] [place], [place] [address]   MRSA PCR Screening     Status: None   Collection Time: 03/03/20  5:08 AM   Specimen: Nasopharyngeal  Result Value Ref Range Status   MRSA by PCR NEGATIVE NEGATIVE Final    Comment:        The GeneXpert MRSA Assay (FDA approved for NASAL specimens only), is one component of a comprehensive MRSA colonization surveillance program. It is not intended to diagnose MRSA infection nor to guide or monitor treatment for MRSA infections. Performed at [facility], [address] [place], [place] [address]     Coagulation Studies: No results for input(s): LABPROT, INR in the last 72 hours.  Urinalysis:  Recent Labs  Lab 03/02/20 2255  COLORURINE STRAW*  LABSPEC 1.005  PHURINE 5.0  GLUCOSEU >=500*  HGBUR NEGATIVE  BILIRUBINUR NEGATIVE  KETONESUR NEGATIVE  PROTEINUR NEGATIVE  NITRITE NEGATIVE  LEUKOCYTESUR NEGATIVE    Lipid Panel:     Component Value Date/Time   TRIG 108 03/05/2020 0411    HgbA1C: No results found for: HGBA1C  Urine Drug Screen:      Component Value Date/Time   LABOPIA NONE  DETECTED 03/02/2020 2255   COCAINSCRNUR POSITIVE (A) 03/02/2020 2255   LABBENZ NONE DETECTED 03/02/2020 2255   AMPHETMU NONE DETECTED 03/02/2020 2255   THCU NONE DETECTED 03/02/2020 2255   LABBARB NONE DETECTED 03/02/2020 2255    Alcohol Level:  Recent Labs  Lab 03/03/20 0100  ETH <10    Other results: EKG: sinus rhythm at 66 bpm.  Imaging: CT ABDOMEN PELVIS WO CONTRAST  Result Date: 03/05/2020 CLINICAL DATA:  Ingestion of foreign body EXAM: CT ABDOMEN AND PELVIS WITHOUT CONTRAST TECHNIQUE: Multidetector CT imaging of the abdomen and pelvis was performed following the standard protocol without IV contrast. COMPARISON:  12/29/2019 FINDINGS: Lower chest: Airspace disease in both lower lobes with dense consolidation on the left where there is some volume loss. Hepatobiliary: No focal liver abnormality.No evidence of biliary obstruction or stone. Pancreas: Unremarkable. Spleen: Unremarkable. Adrenals/Urinary Tract: Negative adrenals. Mild bilateral hydroureteronephrosis is due to an over distended urinary bladder which reaches the umbilicus. Stomach/Bowel: Linear metallic foreign body associated with the ventral stomach wall at the level of the body. There is an extraluminal  linear metallic body at the level of the pylorus. Both of these are unchanged not significantly changed from before. Metallic densities associated with the ileocolic mesentery and ileocecal region. There has likely been enterocolonic anastomosis in the right lower quadrant. No new foreign body is seen. An enteric tube tip reaches the proximal stomach. Vascular/Lymphatic: No acute vascular abnormality. No mass or adenopathy. Reproductive:Negative Other: No ascites or pneumoperitoneum. There are multiple metallic foreign bodies in the subcutaneous ventral abdominal wall that are not significantly changed. Musculoskeletal: No acute abnormalities. These results will be called to the ordering clinician or representative by the  Radiologist Assistant, and communication documented in the PACS or [employer]. IMPRESSION: 1. Bilateral lower lobe pneumonia with dense consolidation on the left. The pattern suggests aspiration. 2. Over distended bladder leading to bilateral hydroureteronephrosis. 3. Multiple intra-abdominal and subcutaneous metallic foreign bodies without significant change from January 2021. No visible bowel perforation or obstruction. Electronically Signed   By: Monte Fantasia M.D.   On: 03/05/2020 10:05   DG Abd 1 View  Result Date: 03/05/2020 CLINICAL DATA:  NG tube placement EXAM: ABDOMEN - 1 VIEW COMPARISON:  None. FINDINGS: The enteric tube tip projects over the gastric body. The side hole projects near the GE junction. Again noted is a linear radiopaque foreign body projecting over the right upper quadrant. The bowel gas pattern is nonspecific and nonobstructive. Old healed right-sided rib fractures are noted. IMPRESSION: Enteric tube tip projects over the gastric body. The side hole projects near the GE junction. Again noted is a linear foreign body projecting over the right upper quadrant. Electronically Signed   By: Constance Holster M.D.   On: 03/05/2020 02:15   DG Chest Port 1 View  Result Date: 03/05/2020 CLINICAL DATA:  Check central line placement EXAM: PORTABLE CHEST 1 VIEW COMPARISON:  Film from earlier in the same day. FINDINGS: Endotracheal tube and gastric catheter are again seen and stable. Left jugular central line is noted with the catheter tip in the mid superior vena cava. No pneumothorax is seen. The lungs are well aerated with persistent left basilar opacity. No bony abnormality is seen. IMPRESSION: No pneumothorax following central line placement on the left. Electronically Signed   By: Inez Catalina M.D.   On: 03/05/2020 03:35   DG Chest Port 1 View  Result Date: 03/05/2020 CLINICAL DATA:  Hypoxia EXAM: PORTABLE CHEST 1 VIEW COMPARISON:  03/02/2020 FINDINGS: Cardiac shadow is within  normal limits. Gastric catheter is noted within the stomach although the proximal side port is noted in the distal esophagus. This could be advanced several cm further into the stomach. Endotracheal tube is noted in satisfactory position 5.8 cm above the carina. Lungs are well aerated bilaterally. Slight increase in left basilar infiltrative opacity is seen. IMPRESSION: Tubes and lines as described above. The gastric catheter could be advanced further into the stomach. New left basilar infiltrative opacity. Electronically Signed   By: Inez Catalina M.D.   On: 03/05/2020 02:15   DG Abd Portable 2V  Result Date: 03/05/2020 CLINICAL DATA:  Concern for radiopaque foreign body. Patient reportedly consuming non-food objects EXAM: PORTABLE ABDOMEN - 2 VIEW COMPARISON:  March 05, 2013 study obtained earlier in the day FINDINGS: Supine and erect images obtained. Nasogastric tube tip and side port in body of stomach. There are multiple linear radiopaque foreign bodies scattered throughout the abdomen and upper pelvis as well as a slightly thicker metallic foreign body measuring 2.2 cm, located in the left mid abdomen. There  is no bowel dilatation or air-fluid level to suggest bowel obstruction. No free air noted. Lung bases clear. Postoperative change noted in right mid abdomen. IMPRESSION: Multiple radiopaque foreign bodies present, likely either within small bowel or transverse colon. Suspected portion of needle in left mid abdomen, likely in jejunum. No bowel obstruction or free air evident. Nasogastric tube tip and side port in stomach. Lung bases clear. Electronically Signed   By: Lowella Grip III M.D.   On: 03/05/2020 08:40   ECHOCARDIOGRAM COMPLETE  Result Date: 03/03/2020    ECHOCARDIOGRAM REPORT   Patient Name:   HURSHELL DINO Date of Exam: 03/03/2020 Medical Rec #:  [id number]        Height:       70.0 in Accession #:    [id number]       Weight:       189.4 lb Date of Birth:  01/02/1981        BSA:           2.039 m Patient Age:    53 years         BP:           80/56 mmHg Patient Gender: M                HR:           58 bpm. Exam Location:  [facility] Procedure: 2D Echo, Intracardiac Opacification Agent, Cardiac Doppler and Color            Doppler Indications:     ABN ECG 794.31  History:         Patient has no prior history of Echocardiogram examinations.  Sonographer:     Alyse Low Roar Referring Phys:  [id number] Bradly Bienenstock Diagnosing Phys: Ida Rogue MD IMPRESSIONS  1. Left ventricular ejection fraction, by estimation, is 60 to 65%. The left ventricle has normal function. The left ventricle has no regional wall motion abnormalities. There is moderate left ventricular hypertrophy. Left ventricular diastolic parameters were normal.  2. Right ventricular systolic function was not well visualized. The right ventricular size is normal. There is mildly elevated pulmonary artery systolic pressure. The estimated right ventricular systolic pressure is 27.7 mmHg.  3. The inferior vena cava is dilated in size with <50% respiratory variability, suggesting right atrial pressure of 15 mmHg. FINDINGS  Left Ventricle: Left ventricular ejection fraction, by estimation, is 60 to 65%. The left ventricle has normal function. The left ventricle has no regional wall motion abnormalities. The left ventricular internal cavity size was normal in size. There is  moderate left ventricular hypertrophy. Left ventricular diastolic parameters were normal. Right Ventricle: The right ventricular size is normal. No increase in right ventricular wall thickness. Right ventricular systolic function was not well visualized. There is mildly elevated pulmonary artery systolic pressure. The tricuspid regurgitant velocity is 2.23 m/s, and with an assumed right atrial pressure of 15 mmHg, the estimated right ventricular systolic pressure is 41.2 mmHg. Left Atrium: Left atrial size was normal in size. Right Atrium: Right atrial size was normal in size.  Pericardium: There is no evidence of pericardial effusion. Mitral Valve: The mitral valve is normal in structure. Normal mobility of the mitral valve leaflets. No evidence of mitral valve regurgitation. No evidence of mitral valve stenosis. Tricuspid Valve: The tricuspid valve is normal in structure. Tricuspid valve regurgitation is mild . No evidence of tricuspid stenosis. Aortic Valve: The aortic valve is normal in structure. Aortic valve regurgitation is not visualized.  No aortic stenosis is present. Aortic valve mean gradient measures 2.0 mmHg. Aortic valve peak gradient measures 2.7 mmHg. Aortic valve area, by VTI measures 2.85 cm. Pulmonic Valve: The pulmonic valve was normal in structure. Pulmonic valve regurgitation is not visualized. No evidence of pulmonic stenosis. Aorta: The aortic root is normal in size and structure. Venous: The inferior vena cava is dilated in size with less than 50% respiratory variability, suggesting right atrial pressure of 15 mmHg. IAS/Shunts: No atrial level shunt detected by color flow Doppler.  LEFT VENTRICLE PLAX 2D LVIDd:         4.32 cm  Diastology LVIDs:         3.20 cm  LV e' lateral:   6.53 cm/s LV PW:         1.35 cm  LV E/e' lateral: 8.1 LV IVS:        1.46 cm  LV e' medial:    8.27 cm/s LVOT diam:     2.00 cm  LV E/e' medial:  6.4 LV SV:         39 LV SV Index:   19 LVOT Area:     3.14 cm  RIGHT VENTRICLE RV Mid diam:    2.72 cm RV S prime:     7.40 cm/s LEFT ATRIUM           Index       RIGHT ATRIUM          Index LA diam:      3.70 cm 1.81 cm/m  RA Area:     9.20 cm LA Vol (A4C): 28.3 ml 13.88 ml/m RA Volume:   17.10 ml 8.38 ml/m  AORTIC VALVE                   PULMONIC VALVE AV Area (Vmax):    2.91 cm    PV Vmax:        0.68 m/s AV Area (Vmean):   3.07 cm    PV Peak grad:   1.8 mmHg AV Area (VTI):     2.85 cm    RVOT Peak grad: 1 mmHg AV Vmax:           82.70 cm/s AV Vmean:          58.000 cm/s AV VTI:            0.138 m AV Peak Grad:      2.7 mmHg AV Mean  Grad:      2.0 mmHg LVOT Vmax:         76.60 cm/s LVOT Vmean:        56.700 cm/s LVOT VTI:          0.125 m LVOT/AV VTI ratio: 0.91  AORTA Ao Root diam: 2.60 cm MITRAL VALVE               TRICUSPID VALVE MV Area (PHT): 3.42 cm    TR Peak grad:   19.9 mmHg MV Decel Time: 222 msec    TR Vmax:        223.00 cm/s MV E velocity: 52.80 cm/s MV A velocity: 28.10 cm/s  SHUNTS MV E/A ratio:  1.88        Systemic VTI:  0.12 m                            Systemic Diam: 2.00 cm Ida Rogue MD Electronically signed by Ida Rogue MD Signature Date/Time:  03/03/2020/5:10:30 PM    Final      Assessment/Plan: 39 y.o. male who is unable to provide any history due to being intubated and sedated, therefore all history obtained from the chart.  Patient was found unresponsive in a gas station bathroom with an empty pill bottle beside him.  Patient was brought to the ED where he required intubation.  UDS positive for tricyclics and cocaine.  Overnight patient became agitated and was noted to have a generalized tonic-clonic seizure.  Patient given Versed and Propofol/Versed were increased.  Keppra loaded and on maintenance at this time. Head CT from admission personally reviewed and shows no acute changes.  Recommendations: 1. Agree with continued anticonvulsant therapy.  Seizures likely with TCA overdoses 2. EEG and long term EEG monitoring due to need for patient to remain sedated and possibility of subclinical seizure activity.  Patient may need transfer for these services since patient unable to have either of these at this facility today.  3. Seizure precautions 4. Serum magnesium and phosphorus  Alexis Goodell, MD Neurology [phone] 03/05/2020, 11:27 AM

## 2020-03-05 NOTE — Procedures (Signed)
Central Venous Catheter Insertion Procedure Note BLANE WORTHINGTON [id number] 1981/04/08  Procedure: Insertion of Central Venous Catheter Indications: Assessment of intravascular volume, Drug and/or fluid administration and Frequent blood sampling  Procedure Details Consent: Unable to obtain consent because of emergent medical necessity. Time Out: Verified patient identification, verified procedure, site/side was marked, verified correct patient position, special equipment/implants available, medications/allergies/relevent history reviewed, required imaging and test results available.  Performed  Maximum sterile technique was used including antiseptics, cap, gloves, gown, hand hygiene, mask and sheet. Skin prep: Chlorhexidine; local anesthetic administered A antimicrobial bonded/coated triple lumen catheter was placed in the left internal jugular vein using the Seldinger technique.  Evaluation Blood flow good Complications: No apparent complications Patient did tolerate procedure well. Chest X-ray ordered to verify placement.  CXR: pending.   Procedure was performed using Ultrasound for direct visualization of cannulization of Left IJ.  Line was secured at the 20 cm mark.  BIOPATCH was placed at insertion site.    Harlon Ditty, AGACNP-BC [facility] Pager: [phone]  Judithe Modest 03/05/2020, 3:08 AM

## 2020-03-05 NOTE — Consult Note (Signed)
SURGICAL CONSULTATION NOTE   HISTORY OF PRESENT ILLNESS (HPI):  39 y.o. male presented to [facility] ED for evaluation of admitted to ICU due to drug overdose requiring intubation and mechanical ventilation.  Patient has been found positive for cocaine and tricyclics.  Patient has a significant past medical history of malingering mixed personality disorder, hypertension, hepatitis C, multiple episode of foreign body ingestion.  Patient had an episode of seizures overnight.  Upon complete work-up he was found with foreign bodies in the abdominal cavity.  She had a CT scan done that shows multiple subcutaneous metallic objects and a linear metallic object close to the pyloric area.  There are also multiple suture lines 1 in the stomach and one in the right lower quadrant.  Unable to get history from patient due to sedation on mechanical ventilation.  All history taken from ICU physician.  Surgery is consulted by Dr. Jayme Cloud in this context for evaluation and management of abdominal cavity foreign body.  PAST MEDICAL HISTORY (PMH):  Benzodiazepine dependence Multiple illegal drug dependence History of drug overdose Hypertension Hepatitis C is Mixed personality disorder  PAST SURGICAL HISTORY (PSH):  Multiple exploratory laparotomies for foreign body extractions Multiple upper and lower endoscopies for foreign body extractions  MEDICATIONS:  Wellbutrin 200 mg 2 times daily Carbamazepine 200 mg 2 tablets by mouth 2 times daily Gabapentin 900 mg 3 times daily Vistaril 50 mg 4 times daily as needed for anxiety Lorazepam 1 mg every 8 hours as needed for anxiety Mirtazapine 30 mg nightly Olanzapine 10 mg nightly  ALLERGIES:  Allergic to vancomycin, Motrin and Pepcid  SOCIAL HISTORY:  Social History   Socioeconomic History  . Marital status: Single    Spouse name: Not on file  . Number of children: Not on file  . Years of education: Not on file  . Highest education level: Not on file    Occupational History  . Not on file  Tobacco Use  . Smoking status: Not on file  Substance and Sexual Activity  . Alcohol use: Yes  . Drug use: Yes  . Sexual activity: Not on file  Other Topics Concern  . Not on file  Social History Narrative  . Not on file   Social Determinants of Health   Financial Resource Strain:   . Difficulty of Paying Living Expenses:   Food Insecurity:   . Worried About Programme researcher, broadcasting/film/video in the Last Year:   . Barista in the Last Year:   Transportation Needs:   . Freight forwarder (Medical):   Marland Kitchen Lack of Transportation (Non-Medical):   Physical Activity:   . Days of Exercise per Week:   . Minutes of Exercise per Session:   Stress:   . Feeling of Stress :   Social Connections:   . Frequency of Communication with Friends and Family:   . Frequency of Social Gatherings with Friends and Family:   . Attends Religious Services:   . Active Member of Clubs or Organizations:   . Attends Banker Meetings:   Marland Kitchen Marital Status:   Intimate Partner Violence:   . Fear of Current or Ex-Partner:   . Emotionally Abused:   Marland Kitchen Physically Abused:   . Sexually Abused:       FAMILY HISTORY:  Family history review.  Not pertinent for this admission  REVIEW OF SYSTEMS:  Unable to get review of system from patient due to sedation and mechanical intubation   VITAL SIGNS:  Temp:  [  98.3 F (36.8 C)-100.7 F (38.2 C)] 99.1 F (37.3 C) (04/04 0400) Pulse Rate:  [65-92] 71 (04/03 1830) Resp:  [20-22] 22 (04/03 1830) BP: (124-167)/(77-101) 135/86 (04/03 1830) SpO2:  [91 %-100 %] 99 % (04/03 1830) FiO2 (%):  [21 %-30 %] 30 % (04/04 0746)     Height: 5\' 10"  (177.8 cm) Weight: 87.4 kg BMI (Calculated): 27.65   INTAKE/OUTPUT:  This shift: Total I/O In: 2508 [I.V.:2208; IV Piggyback:300] Out: 1800 [Urine:1800]  Last 2 shifts: @IOLAST2SHIFTS @   PHYSICAL EXAM:  Constitutional:  -- Normal body habitus  --Sedated Eyes:  -- Pupils equally  round and reactive to light  -- No scleral icterus  Ear, nose, and throat:  -- No jugular venous distension  Pulmonary:  -- No crackles  -- Equal breath sounds bilaterally -- Breathing non-labored at rest Cardiovascular:  -- S1, S2 present  -- No pericardial rubs Gastrointestinal:  -- Abdomen soft, nontender, non-distended, no guarding or rebound tenderness -- No abdominal masses appreciated, pulsatile or otherwise  Musculoskeletal and Integumentary:  -- Wounds: None appreciated -- Extremities: B/L UE and LE FROM, hands and feet warm, no edema  Neurologic:  -- Motor function: Sedated -- Sensation: Sedated   Labs:  CBC Latest Ref Rng & Units 03/05/2020 03/03/2020 03/02/2020  WBC 4.0 - 10.5 K/uL 16.3(H) 5.2 8.4  Hemoglobin 13.0 - 17.0 g/dL 12.7(L) 13.0 14.3  Hematocrit 39.0 - 52.0 % 41.3 41.0 44.9  Platelets 150 - 400 K/uL 146(L) PLATELET CLUMPS NOTED ON SMEAR, UNABLE TO ESTIMATE 206   CMP Latest Ref Rng & Units 03/05/2020 03/03/2020 03/03/2020  Glucose 70 - 99 mg/dL 113(H) 88 91  BUN 6 - 20 mg/dL 12 14 14   Creatinine 0.61 - 1.24 mg/dL 1.17 1.08 1.10  Sodium 135 - 145 mmol/L 143 143 143  Potassium 3.5 - 5.1 mmol/L 3.4(L) 3.9 4.0  Chloride 98 - 111 mmol/L 113(H) 112(H) 111  CO2 22 - 32 mmol/L 25 27 25   Calcium 8.9 - 10.3 mg/dL 8.3(L) 8.6(L) 8.6(L)  Total Protein g/dL - - -  Total Bilirubin mg/dL - - -  Alkaline Phos U/L - - -  AST U/L - - -  ALT U/L - - -    Imaging studies:   11/24/18 CT scan of abdomen and pelvis at [facility]: same object on pyloric area     07/04/2019 CT scan of abdomen and pelvis at [facility]: metalic object on pyloric area   03/05/2020 CT abdomen and pelvis at [facility]     Assessment/Plan:  38 y.o. male with multiple foreign object in the abdominal cavity, complicated by pertinent comorbidities including previous exploratory laparotomy's for same issues, mixed personality disorder, hypertension, episode of seizure, drug overdose.  Patient consulted for evaluation  of multiple foreign object in the abdominal cavity.  There has been no new history of the objects.  There was an incidental finding during the work-up for his overdose and seizures.  Upon evaluation of his chart at [facility] admissions the current foreign objects are identified on the CT scan done today are chronic and stable.  Patient has had multiple interventions at [facility] for this matter.  Last exploratory laparotomy was on October 19, 2019.  He had another endoscopy done 2 days later for a new foreign object ingestion.  The most prominent object seen on the CT scan is 1 close to the pelvic area.  He has been stable for years.  The other object reported by the radiologist on the ventral stomach wall  seems to be a suture line most likely from previous exploratory laparotomy.  Multiple subcutaneous metallic object also stable from previous images.  I personally evaluated the images from [facility] and CT scan of the abdominal pelvis done today.  I do not recommend any surgical management at this moment.  There is no contraindication to continue treatment for his seizures and drug overdose.  These recommendations were discussed with ICU physician.  Contact us if you have any concern.   Gae Gallop, MD

## 2020-03-06 DIAGNOSIS — R4182 Altered mental status, unspecified: Secondary | ICD-10-CM

## 2020-03-06 DIAGNOSIS — R569 Unspecified convulsions: Secondary | ICD-10-CM

## 2020-03-06 LAB — CBC
HCT: 35.5 % — ABNORMAL LOW (ref 39.0–52.0)
Hemoglobin: 10.9 g/dL — ABNORMAL LOW (ref 13.0–17.0)
MCH: 27.6 pg (ref 26.0–34.0)
MCHC: 30.7 g/dL (ref 30.0–36.0)
MCV: 89.9 fL (ref 80.0–100.0)
Platelets: 113 10*3/uL — ABNORMAL LOW (ref 150–400)
RBC: 3.95 MIL/uL — ABNORMAL LOW (ref 4.22–5.81)
RDW: 15.2 % (ref 11.5–15.5)
WBC: 12.8 10*3/uL — ABNORMAL HIGH (ref 4.0–10.5)
nRBC: 0 % (ref 0.0–0.2)

## 2020-03-06 LAB — BASIC METABOLIC PANEL
Anion gap: 8 (ref 5–15)
BUN: 9 mg/dL (ref 6–20)
CO2: 20 mmol/L — ABNORMAL LOW (ref 22–32)
Calcium: 6.9 mg/dL — ABNORMAL LOW (ref 8.9–10.3)
Chloride: 116 mmol/L — ABNORMAL HIGH (ref 98–111)
Creatinine, Ser: 1.03 mg/dL (ref 0.61–1.24)
GFR calc Af Amer: >60 mL/min (ref >60)
GFR calc non Af Amer: >60 mL/min (ref >60)
Glucose, Bld: 75 mg/dL (ref 70–99)
Potassium: 3.1 mmol/L — ABNORMAL LOW (ref 3.5–5.1)
Sodium: 144 mmol/L (ref 135–145)

## 2020-03-06 LAB — PROCALCITONIN: Procalcitonin: 0.48 ng/mL

## 2020-03-06 LAB — PHOSPHORUS: Phosphorus: 3 mg/dL (ref 2.5–4.6)

## 2020-03-06 LAB — MAGNESIUM: Magnesium: 1.8 mg/dL (ref 1.7–2.4)

## 2020-03-06 MED ORDER — ACETAMINOPHEN 325 MG PO TABS: 650.0000 mg | ORAL_TABLET | ORAL | Status: DC | PRN

## 2020-03-06 MED ORDER — VITAL AF 1.2 CAL PO LIQD
1000.0000 mL | ORAL | Status: DC
  Administered 2020-03-06: 17:00:00 1000 mL

## 2020-03-06 MED ORDER — SODIUM CHLORIDE 0.9 % IV SOLN
3.0000 g | Freq: Four times a day (QID) | INTRAVENOUS | Status: DC
  Administered 2020-03-06 – 2020-03-11 (×21): 3 g via INTRAVENOUS
  Filled 2020-03-06: qty 3
  Filled 2020-03-06: qty 8
  Filled 2020-03-06 (×4): qty 3
  Filled 2020-03-06 (×2): qty 8
  Filled 2020-03-06 (×5): qty 3
  Filled 2020-03-06 (×2): qty 8
  Filled 2020-03-06 (×6): qty 3
  Filled 2020-03-06: qty 8
  Filled 2020-03-06: qty 3
  Filled 2020-03-06: qty 8

## 2020-03-06 MED ORDER — POTASSIUM CHLORIDE 20 MEQ/15ML (10%) PO SOLN
30.0000 meq | Freq: Two times a day (BID) | ORAL | Status: AC
  Administered 2020-03-06 (×2): 30 meq
  Filled 2020-03-06 (×2): qty 30

## 2020-03-06 MED ORDER — CLONAZEPAM 1 MG PO TABS: 1.0000 mg | ORAL_TABLET | Freq: Three times a day (TID) | ORAL | Status: DC

## 2020-03-06 MED ORDER — CLONAZEPAM 1 MG PO TABS
1.0000 mg | ORAL_TABLET | Freq: Three times a day (TID) | ORAL | Status: DC
  Administered 2020-03-06 (×2): 1 mg
  Filled 2020-03-06 (×2): qty 1

## 2020-03-06 MED ORDER — CHLORHEXIDINE GLUCONATE CLOTH 2 % EX PADS
6.0000 | MEDICATED_PAD | Freq: Every day | CUTANEOUS | Status: DC
  Administered 2020-03-07 (×2): 6 via TOPICAL

## 2020-03-06 MED ORDER — LORAZEPAM 2 MG/ML IJ SOLN: 2.0000 mg | INTRAMUSCULAR | Status: DC | PRN

## 2020-03-06 MED ORDER — SODIUM CHLORIDE 0.9% FLUSH
10.0000 mL | INTRAVENOUS | Status: DC | PRN
  Administered 2020-03-09: 10:00:00 10 mL

## 2020-03-06 MED ORDER — SODIUM CHLORIDE 0.9% FLUSH
10.0000 mL | Freq: Two times a day (BID) | INTRAVENOUS | Status: DC
  Administered 2020-03-07: 10:00:00 10 mL
  Administered 2020-03-07: 22:00:00 30 mL
  Administered 2020-03-08: 10 mL

## 2020-03-06 NOTE — Progress Notes (Addendum)
NAME:  Jared Kramer, MRN:  [id number], DOB:  07-17-1981, LOS: 1 ADMISSION DATE:  03/05/2020, CONSULTATION DATE: 03/05/20 REFERRING MD:  EDP, CHIEF COMPLAINT:  Seizures   Brief History   Jared Kramer is a 39 y.o male with a history of several foreign body ingestions (batteries and razors), mixed personality disorder, and factious disorder who presented to [facility] on 4/1 after a drug overdose (UDS + cocaine and TCA). He was subsequently intubated for airway protection. On 4/4 he experience 2 grand-mal seizures and was transferred to [facility] for prolonged EEG.   Past Medical History  Factious Disorder  Mixed personality disorder  Foreign body ingestion (razors, batteries) Polysubstance use disorder (Benzos, opiates) Hepatitis C HTN  Significant Hospital Events   4/1: Intubated in ED for airway protection 4/2: Admitted to ICU at [facility]  4/4: 2 grand mal seizures and transferred to [facility]  Consults:  Neurology   Procedures:  4/1: Intubation   Significant Diagnostic Tests:  4/1: Chest x-ray>>Endotracheal tube terminates 4 cm above the carina. Lungs are clear.  No pleural effusion or pneumothorax.The heart is normal in size.Old right posterior rib fracture deformities.Enteric tube courses into the stomach. 4/1: Abdominal 1 view x-ray>>Enteric tube terminates in the proximal gastric body. Linear radiopaque foreign body overlies the right upper abdomen 4/2: CT head without contrast>>No acute intracranial abnormality noted. Minimal air-fluid level within the left maxillary antrum. 4/4: CXR>> New left basilar infiltrative opacity. 4/4: DG Abd 2 view>> Multiple radiopaque foreign bodies present, likely either within small bowel or transverse colon. Suspected portion of needle in left mid abdomen, likely in jejunum. No bowel obstruction or free air evident. Nasogastric tube tip and side port in stomach. Lung bases clear. 4/4 CT abd/pelvis>> Bilateral lower lobe pneumonia with dense consolidation on the  left. The pattern suggests aspiration. Over distended bladder leading to bilateral Hydroureteronephrosis. Multiple intra-abdominal and subcutaneous metallic foreign bodies without significant change from January 2021. No visible bowel perforation or obstruction.  Micro Data:  SARS-CoV-2 PCR 4/1>> negative Influenza PCR 4/1>> negative Respiratory cultures 4/4 >> gram-positive rods  Antimicrobials:  Unasyn 4/4>> current  Interim history/subjective:   Remains on vent. No acute events  Objective   Blood pressure 129/74, pulse 65, temperature 98.1 F (36.7 C), temperature source Axillary, resp. rate 20, SpO2 100 %.    Vent Mode: PRVC FiO2 (%):  [21 %-40 %] 30 % Set Rate:  [20 bmp] 20 bmp Vt Set:  [20 mL-4503 mL] 580 mL PEEP:  [5 cmH20] 5 cmH20 Plateau Pressure:  [14 cmH20-18 cmH20] 17 cmH20   Intake/Output Summary (Last 24 hours) at 03/06/2020 0846 Last data filed at 03/06/2020 0600 Gross per 24 hour  Intake 1626.78 ml  Output 500 ml  Net 1126.78 ml   There were no vitals filed for this visit.  Examination: Gen:      No acute distress HEENT:  EOMI, sclera anicteric Neck:     No masses; no thyromegaly, ETT Lungs:    Clear to auscultation bilaterally; normal respiratory effort CV:         Regular rate and rhythm; no murmurs Abd:      + bowel sounds; soft, non-tender; no palpable masses, no distension Ext:    No edema; adequate peripheral perfusion Skin:      Warm and dry; no rash Neuro: Sedated  Labs significant for potassium 3.1, creatinine 1.03, calcium 6.9, WBC 12.8 No new imaging  Resolved Hospital Problem list   N/A  Assessment & Plan:  Acute hypoxic  respiratory failure due to polysubstance overdose Intubated for airway protection due to drug overdose Continue full vent support SBT's when mental status improves Start weaning sedation  Lower lobe airspace disease seen on CT Possible aspiration event prior to admission. Continue Unasyn.  We will stop if PCT is  low.  Acute metabolic encephalopathy secondary to drug overdose Unsure if intentional vs. Unintentional - Urine drug screen is positive for cocaine and tricyclics - Ethyl alcohol < 10 - Salicylates & Acetaminophen: Undetectable levels - Monitor QTc closely in setting of TCA overdose  - Psych consult once extubated  - Currently involuntarily committed     Seizure like activity  Keppra per neuro, long-term EEG monitoring. Currently on Versed at 10.  Discussed with neuro.  We will take him off benzos while monitoring for seizures.   Foreign body ingestion - CT abdomen and pelvis noncontrasted showed multiple foreign objects - General surgery consulted at [facility] reviewed outside records and felt that these foreign bodies were stable from prior episodes and sutures. Recommended against surgical intervention at this point.    Urinary retention - CT Abd/Pelvis with severally distended bladder.  - Possible secondary to medications  - Maintain foley   Best practice:  Diet: Tube feeds Pain/Anxiety/Delirium protocol (if indicated): Propofol and Fentanyl, wean off Versed drip. VAP protocol (if indicated): Per order set DVT prophylaxis: Lovenox GI prophylaxis: Famotidine Glucose control: CBG and SSI every 4 hours  Mobility: Bed rest Code Status: Full Family Communication: Father Onalee Hua updated 4/5 Disposition: ICU  Critical care time:   The patient is critically ill with multiple organ system failure and requires high complexity decision making for assessment and support, frequent evaluation and titration of therapies, advanced monitoring, review of radiographic studies and interpretation of complex data.   Critical Care Time devoted to patient care services, exclusive of separately billable procedures, described in this note is 35 minutes.   Chilton Greathouse MD [facility] Please see [email or web address] for pager details.  03/06/2020, 8:47 AM

## 2020-03-06 NOTE — Progress Notes (Signed)
Subjective: No further seizure-like episodes overnight.  ROS: Unable to obtain due to poor mental status  Examination  Vital signs in last 24 hours: Temp:  [98.1 F (36.7 C)-99.9 F (37.7 C)] 99.6 F (37.6 C) (04/05 1200) Pulse Rate:  [63-92] 69 (04/05 1100) Resp:  [10-21] 16 (04/05 1100) BP: (79-130)/(51-80) 123/71 (04/05 1100) SpO2:  [94 %-100 %] 100 % (04/05 1111) FiO2 (%):  [30 %-40 %] 30 % (04/05 1111)  General: lying in bed, not in apparent distress CVS: pulse-normal rate and rhythm RS: breathing comfortably, intubated Extremities: normal, warm  Neuro: MS: Comatose, does not open eyes to noxious stimuli CN: pupils equal and reactive, corneal reflex intact, cough reflex intact  Motor: Flaccid, does not bridge noxious stimuli in bilateral upper extremities, withdraws to noxious stimuli in bilateral lower extremities Reflexes: Areflexia in bilateral upper extremities, 3+ in bilateral lower extremities with 2-3 beats nonsustained clonus in left lower extremity.   Basic Metabolic Panel: Recent Labs  Lab 03/03/20 0705 03/03/20 0705 03/03/20 1308 03/03/20 1308 03/05/20 0411 03/05/20 1620 03/05/20 1635 03/06/20 0724  NA 143   < > 143  --  143 143 142 144  K 4.0   < > 3.9  --  3.4* 3.5 3.7 3.1*  CL 111  --  112*  --  113*  --  111 116*  CO2 25  --  27  --  25  --  25 20*  GLUCOSE 91  --  88  --  113*  --  89 75  BUN 14  --  14  --  12  --  8 9  CREATININE 1.10  --  1.08  --  1.17  --  1.16 1.03  CALCIUM 8.6*   < > 8.6*   < > 8.3*  --  8.0* 6.9*  MG  --   --   --   --  1.8  --   --   --   PHOS  --   --   --   --  3.8  --   --   --    < > = values in this interval not displayed.    CBC: Recent Labs  Lab 03/02/20 2254 03/03/20 0705 03/05/20 0411 03/05/20 1620 03/06/20 0724  WBC 8.4 5.2 16.3*  --  12.8*  NEUTROABS 6.1  --   --   --   --   HGB 14.3 13.0 12.7* 11.2* 10.9*  HCT 44.9 41.0 41.3 33.0* 35.5*  MCV 87.0 88.0 89.6  --  89.9  PLT 206 PLATELET CLUMPS  NOTED ON SMEAR, UNABLE TO ESTIMATE 146*  --  113*     Coagulation Studies: No results for input(s): LABPROT, INR in the last 72 hours.  Imaging CT head without contrast 03/03/2020: No acute intracranial abnormality.   ASSESSMENT AND PLAN: 39 year old male presented with new onset generalized tonic-clonic seizures in setting of drug overdose (UDS positive for cocaine and tricyclic antidepressant).  Acute toxic-metabolic encephalopathy New onset seizures Cocaine use disorder Suspected TCA overdose Aspiration pneumonia Hydroureteronephrosis Leukocytosis Anemia Thrombocytopenia Hypokalemia Hypocalcemia -LTM EEG did not show any seizure-like episodes overnight. -EKG on 03/05/2020 showed normal sinus rhythm with QTC of 426.  Recommendations -We will start weaning of Versed at 2 mL/h to stop. - Will also start clonazepam 1 mg 3 times daily as sedation is weaned off. - Continue Keppra 1000 mg twice daily, will likely not require long-term if this presentation was likely secondary to provoked seizure. -Continue LTM  EEG while sedation is weaned off.  We will likely discontinue tomorrow -We will obtain MRI brain without contrast after EEG is discontinued, likely tomorrow -Continue management of rest of the comorbidities per primary team   CRITICAL CARE Performed by: Lora Havens   Total critical care time:35 minutes  Critical care time was exclusive of separately billable procedures and treating other patients.  Critical care was necessary to treat or prevent imminent or life-threatening deterioration.  Critical care was time spent personally by me on the following activities: development of treatment plan with patient and/or surrogate as well as nursing, discussions with consultants, evaluation of patient's response to treatment, examination of patient, obtaining history from patient or surrogate, ordering and performing treatments and interventions, ordering and review of  laboratory studies, ordering and review of radiographic studies, pulse oximetry and re-evaluation of patient's condition.

## 2020-03-06 NOTE — Procedures (Addendum)
Patient Name: Jared Kramer  MRN: [id number]  Epilepsy Attending: Charlsie Quest  Referring Physician/Provider: Dr. Caryl Pina Duration: 03/05/2020 1630 to 03/06/2020 1630  Patient history: 39 year old male with new onset generalized tonic-clonic seizures in the setting of drug overdose (UDS positive for cocaine and tricyclic antidepressant).  EEG to evaluate for seizures.  Level of alertness: Comatose/sedated  AEDs during EEG study: Keppra, Versed  Technical aspects: This EEG study was done with scalp electrodes positioned according to the 10-20 International system of electrode placement. Electrical activity was acquired at a sampling rate of 500Hz  and reviewed with a high frequency filter of 70Hz  and a low frequency filter of 1Hz . EEG data were recorded continuously and digitally stored.   DESCRIPTION:  EEG showed continuous generalized 2-3hz  delta slowing with overriding 15 to 18 Hz beta activity distributed symmetrically and diffusely. Hyperventilation and photic stimulation were not performed.  Event button was pressed on 03/05/2020 at 1856 for whole body seizure like activity. Concomitant EEG before, during and after the event didn't show any eeg change to suggest seizure.  ABNORMALITY - Continuous slow, generalized - Excessive beta, generalized    IMPRESSION: This study is suggestive of severe diffuse encephalopathy, non specific to etiology but most likely secondary to sedation. No seizures or epileptiform discharges were seen throughout the recording.  Event button was pressed on 03/05/2020 at 1856 for whole body seizure like activity without concomitant eeg change and therefore this was NOT an epileptic event.   [name] 

## 2020-03-06 NOTE — Progress Notes (Signed)
LTM maintenance completed; checked and reprepped F3, Fp2, and O2. No skin breakdown was seen.

## 2020-03-06 NOTE — Progress Notes (Signed)
Initial Nutrition Assessment  DOCUMENTATION CODES:   Not applicable  INTERVENTION:   Initiate Vital 1.2 @ 75 ml/hr (1800 ml/day) via OG tube  Provides: 2160 kcal, 135 grams of protein, and 1459 ml free water.    NUTRITION DIAGNOSIS:   Inadequate oral intake related to inability to eat as evidenced by NPO status.  GOAL:   Patient will meet greater than or equal to 90% of their needs  MONITOR:   TF tolerance, Vent status  REASON FOR ASSESSMENT:   Consult, Ventilator Enteral/tube feeding initiation and management  ASSESSMENT:   Pt with PMH of several foreign body ingestions (batteries and razors), mixed personality disorder, and factious disorder who was admitted 4/1 to [facility] for drug overdose (UDS positive for cocaine and TCA). Pt intubated and on 4/4 had 2 grand-mal seizures then transferred to [facility] for continuous EEG.   Spoke with RN. Pt is a transfer from [facility]. Per xray pt with foreign body in abd but no change since Jan 2021.   Patient is currently intubated on ventilator support MV: 11.2 L/min Temp (24hrs), Avg:99 F (37.2 C), Min:98.1 F (36.7 C), Max:99.9 F (37.7 C)   Medications reviewed and include: folic acid, 30 mEq KCl BID, thiamine   Labs reviewed: K+ 3.1 (L)     NUTRITION - FOCUSED PHYSICAL EXAM:    Most Recent Value  Orbital Region  No depletion  Upper Arm Region  No depletion  Thoracic and Lumbar Region  No depletion  Buccal Region  No depletion  Temple Region  No depletion  Clavicle Bone Region  No depletion  Clavicle and Acromion Bone Region  No depletion  Scapular Bone Region  Unable to assess  Dorsal Hand  No depletion  Patellar Region  No depletion  Anterior Thigh Region  No depletion  Posterior Calf Region  No depletion  Edema (RD Assessment)  Mild  Hair  Reviewed  Eyes  Unable to assess  Mouth  Unable to assess  Skin  Reviewed  Nails  Reviewed       Diet Order:   Diet Order            Diet NPO time specified  Diet  effective now              EDUCATION NEEDS:   No education needs have been identified at this time  Skin:  Skin Assessment: Reviewed RN Assessment  Last BM:  unknown  Height:   Ht Readings from Last 1 Encounters:  03/03/20 5\' 10"  (1.778 m)    Weight:   Wt Readings from Last 1 Encounters:  03/04/20 87.4 kg    Ideal Body Weight:  75.4 kg  BMI:  There is no height or weight on file to calculate BMI.  Estimated Nutritional Needs:   Kcal:  2162  Protein:  115-130 grams  Fluid:  2 L/day  2163., RD, LDN, CNSC See AMiON for contact information

## 2020-03-07 ENCOUNTER — Inpatient Hospital Stay (HOSPITAL_COMMUNITY): Payer: Medicaid Other

## 2020-03-07 DIAGNOSIS — J9600 Acute respiratory failure, unspecified whether with hypoxia or hypercapnia: Secondary | ICD-10-CM

## 2020-03-07 LAB — BASIC METABOLIC PANEL
Anion gap: 8 (ref 5–15)
BUN: 14 mg/dL (ref 6–20)
CO2: 22 mmol/L (ref 22–32)
Calcium: 7.8 mg/dL — ABNORMAL LOW (ref 8.9–10.3)
Chloride: 111 mmol/L (ref 98–111)
Creatinine, Ser: 1.22 mg/dL (ref 0.61–1.24)
GFR calc Af Amer: >60 mL/min (ref >60)
GFR calc non Af Amer: >60 mL/min (ref >60)
Glucose, Bld: 110 mg/dL — ABNORMAL HIGH (ref 70–99)
Potassium: 4.1 mmol/L (ref 3.5–5.1)
Sodium: 141 mmol/L (ref 135–145)

## 2020-03-07 LAB — POCT I-STAT 7, (LYTES, BLD GAS, ICA,H+H)
Acid-base deficit: 5 mmol/L — ABNORMAL HIGH (ref 0.0–2.0)
Bicarbonate: 20.5 mmol/L (ref 20.0–28.0)
Calcium, Ion: 1.27 mmol/L (ref 1.15–1.40)
HCT: 32 % — ABNORMAL LOW (ref 39.0–52.0)
Hemoglobin: 10.9 g/dL — ABNORMAL LOW (ref 13.0–17.0)
O2 Saturation: 85 %
Patient temperature: 99.9
Potassium: 4.1 mmol/L (ref 3.5–5.1)
Sodium: 142 mmol/L (ref 135–145)
TCO2: 22 mmol/L (ref 22–32)
pCO2 arterial: 39.6 mmHg (ref 32.0–48.0)
pH, Arterial: 7.327 — ABNORMAL LOW (ref 7.350–7.450)
pO2, Arterial: 56 mmHg — ABNORMAL LOW (ref 83.0–108.0)

## 2020-03-07 LAB — CBC
HCT: 35.3 % — ABNORMAL LOW (ref 39.0–52.0)
Hemoglobin: 10.7 g/dL — ABNORMAL LOW (ref 13.0–17.0)
MCH: 27.9 pg (ref 26.0–34.0)
MCHC: 30.3 g/dL (ref 30.0–36.0)
MCV: 92.2 fL (ref 80.0–100.0)
Platelets: 158 10*3/uL (ref 150–400)
RBC: 3.83 MIL/uL — ABNORMAL LOW (ref 4.22–5.81)
RDW: 15.3 % (ref 11.5–15.5)
WBC: 10.2 10*3/uL (ref 4.0–10.5)
nRBC: 0 % (ref 0.0–0.2)

## 2020-03-07 LAB — CULTURE, RESPIRATORY W GRAM STAIN

## 2020-03-07 LAB — LEGIONELLA PNEUMOPHILA SEROGP 1 UR AG: L. pneumophila Serogp 1 Ur Ag: NEGATIVE

## 2020-03-07 LAB — PROCALCITONIN: Procalcitonin: 0.63 ng/mL

## 2020-03-07 LAB — PHOSPHORUS
Phosphorus: 2.9 mg/dL (ref 2.5–4.6)
Phosphorus: 4.4 mg/dL (ref 2.5–4.6)

## 2020-03-07 LAB — MAGNESIUM
Magnesium: 1.8 mg/dL (ref 1.7–2.4)
Magnesium: 1.7 mg/dL (ref 1.7–2.4)

## 2020-03-07 MED ORDER — ACETAMINOPHEN 10 MG/ML IV SOLN
1000.0000 mg | Freq: Once | INTRAVENOUS | Status: AC
  Administered 2020-03-07: 08:00:00 1000 mg via INTRAVENOUS
  Filled 2020-03-07: qty 100

## 2020-03-07 MED ORDER — CYPROHEPTADINE HCL 4 MG PO TABS
12.0000 mg | ORAL_TABLET | Freq: Once | ORAL | Status: AC
  Administered 2020-03-07: 22:00:00 12 mg via ORAL
  Filled 2020-03-07: qty 3

## 2020-03-07 MED ORDER — LABETALOL HCL 5 MG/ML IV SOLN
20.0000 mg | INTRAVENOUS | Status: DC | PRN
  Administered 2020-03-07 – 2020-03-09 (×12): 20 mg via INTRAVENOUS
  Filled 2020-03-07 (×13): qty 4

## 2020-03-07 MED ORDER — ORAL CARE MOUTH RINSE
15.0000 mL | Freq: Two times a day (BID) | OROMUCOSAL | Status: DC
  Administered 2020-03-07 – 2020-03-10 (×8): 15 mL via OROMUCOSAL

## 2020-03-07 MED ORDER — CHLORHEXIDINE GLUCONATE CLOTH 2 % EX PADS
6.0000 | MEDICATED_PAD | Freq: Every day | CUTANEOUS | Status: DC
  Administered 2020-03-08 – 2020-03-11 (×4): 6 via TOPICAL

## 2020-03-07 MED ORDER — CLONAZEPAM 1 MG PO TABS: 1.0000 mg | ORAL_TABLET | Freq: Three times a day (TID) | ORAL | Status: DC | PRN

## 2020-03-07 MED ORDER — CYPROHEPTADINE HCL 4 MG PO TABS
12.0000 mg | ORAL_TABLET | Freq: Once | ORAL | Status: DC
  Filled 2020-03-07: qty 3

## 2020-03-07 MED ORDER — CLONAZEPAM 1 MG PO TABS: 1.0000 mg | ORAL_TABLET | Freq: Three times a day (TID) | ORAL | Status: DC

## 2020-03-07 MED ORDER — CHLORHEXIDINE GLUCONATE 0.12 % MT SOLN
15.0000 mL | Freq: Two times a day (BID) | OROMUCOSAL | Status: DC
  Administered 2020-03-07 – 2020-03-09 (×4): 15 mL via OROMUCOSAL
  Filled 2020-03-07 (×7): qty 15

## 2020-03-07 MED ORDER — LORAZEPAM 2 MG/ML IJ SOLN
2.0000 mg | Freq: Three times a day (TID) | INTRAMUSCULAR | Status: DC
  Administered 2020-03-07 – 2020-03-09 (×6): 2 mg via INTRAVENOUS
  Filled 2020-03-07 (×6): qty 1

## 2020-03-07 NOTE — Progress Notes (Signed)
eLink Physician-Brief Progress Note Patient Name: Jared Kramer DOB: [birth year] MRN: [id number]   Date of Service  03/07/2020  HPI/Events of Note  Patient able to tolerate sips of water without issues. Request to advance to NPO except sips with meds.   eICU Interventions  Will order: 1. Advance diet to NPO except sips with meds.      Intervention Category Major Interventions: Other:  Jared Kramer 03/07/2020, 10:11 PM

## 2020-03-07 NOTE — Progress Notes (Signed)
LTM EEG discontinued -  skin breakdown at [facility]: A1,P7,F4,F8. Nurse notified

## 2020-03-07 NOTE — Progress Notes (Addendum)
Notified CCM of SBPs >160 after labetalol. No new orders

## 2020-03-07 NOTE — Progress Notes (Addendum)
Subjective: No seizures overnight.  Patient self extubated this morning.  ROS: Unable to obtain due to poor mental status  Examination  Vital signs in last 24 hours: Temp:  [99.6 F (37.6 C)-102.2 F (39 C)] 102.2 F (39 C) (04/06 0800) Pulse Rate:  [69-169] 148 (04/06 0933) Resp:  [8-27] 19 (04/06 0933) BP: (100-189)/(56-109) 169/94 (04/06 0933) SpO2:  [96 %-100 %] 98 % (04/06 0933) FiO2 (%):  [30 %] 30 % (04/06 0428) Weight:  [88.2 kg] 88.2 kg (04/06 0500)  General: lying in bed,  CVS: pulse-normal rate and rhythm RS: breathing comfortably,  Extremities: normal, warm  Neuro: MS: Awake, follows simple one-step commands CN: pupils equal and reactive,  EOMI, face symmetric, tongue midline Motor: Antigravity strength in all 4 extremities Reflexes: 2+ bilateral upper extremities, 4+ bilateral lower extremities  Basic Metabolic Panel: Recent Labs  Lab 03/03/20 1308 03/03/20 1308 03/05/20 0411 03/05/20 0411 03/05/20 1620 03/05/20 1635 03/06/20 0724 03/06/20 1813 03/07/20 0509 03/07/20 0636  NA 143   < > 143   < > 143 142 144  --  141 142  K 3.9   < > 3.4*   < > 3.5 3.7 3.1*  --  4.1 4.1  CL 112*  --  113*  --   --  111 116*  --  111  --   CO2 27  --  25  --   --  25 20*  --  22  --   GLUCOSE 88  --  113*  --   --  89 75  --  110*  --   BUN 14  --  12  --   --  8 9  --  14  --   CREATININE 1.08  --  1.17  --   --  1.16 1.03  --  1.22  --   CALCIUM 8.6*   < > 8.3*   < >  --  8.0* 6.9*  --  7.8*  --   MG  --   --  1.8  --   --   --   --  1.8 1.8  --   PHOS  --   --  3.8  --   --   --   --  3.0 2.9  --    < > = values in this interval not displayed.    CBC: Recent Labs  Lab 03/02/20 2254 03/02/20 2254 03/03/20 0705 03/03/20 0705 03/05/20 0411 03/05/20 1620 03/06/20 0724 03/07/20 0509 03/07/20 0636  WBC 8.4  --  5.2  --  16.3*  --  12.8* 10.2  --   NEUTROABS 6.1  --   --   --   --   --   --   --   --   HGB 14.3   < > 13.0   < > 12.7* 11.2* 10.9* 10.7* 10.9*   HCT 44.9   < > 41.0   < > 41.3 33.0* 35.5* 35.3* 32.0*  MCV 87.0  --  88.0  --  89.6  --  89.9 92.2  --   PLT 206  --  PLATELET CLUMPS NOTED ON SMEAR, UNABLE TO ESTIMATE  --  146*  --  113* 158  --    < > = values in this interval not displayed.     Coagulation Studies: No results for input(s): LABPROT, INR in the last 72 hours.  Imaging No brain imaging overnight   ASSESSMENT AND PLAN: 39 year old male presented  with new onset generalized tonic-clonic seizures in setting of drug overdose (UDS positive for cocaine and tricyclic antidepressant).  Acute toxic-metabolic encephalopathy New onset seizures Cocaine use disorder Suspected serotonin syndrome Anemia Hypocalcemia Hypertensive Tachycardic Fever -LTM EEG did not show any seizure-like episodes overnight.   -EKG on 03/05/2020 showed normal sinus rhythm with QTC of 426. -Patient is unable to clearly answer if he overdosed on any medications.  However he is hypertensive, tachycardic, febrile and hyperreflexic in bilateral lower extremities.  No clear source of infection.  Suspect overdose and exam findings concerning for serotonin syndrome  Recommendations -Was planning on starting Klonopin 1 mg 3 times daily but patient does not have p.o. access.  Therefore will start lorazepam 2 mg 3 times daily in the interim for agitation and symptomatic management of serotonin syndrome.  Can be switched to p.o. Klonopin once patient has p.o. access -We will give one-time dose of cyproheptadine 12 mg for serotonin syndrome  - Continue Keppra 1000 mg twice daily, will likely not require long-term if this presentation was likely secondary to provoked seizure. -Goal SBP less than 160, as needed labetalol -Cannot obtain MRI brain as patient has metallic foreign objects -Continue management of rest of the comorbidities per primary team   CRITICAL CARE Performed by: Lora Havens   Total critical care time:35 minutes  Critical care  time was exclusive of separately billable procedures and treating other patients.  Critical care was necessary to treat or prevent imminent or life-threatening deterioration.  Critical care was time spent personally by me on the following activities: development of treatment plan with patient and/or surrogate as well as nursing, discussions with consultants, evaluation of patient's response to treatment, examination of patient, obtaining history from patient or surrogate, ordering and performing treatments and interventions, ordering and review of laboratory studies, ordering and review of radiographic studies, pulse oximetry and re-evaluation of patient's condition.

## 2020-03-07 NOTE — Progress Notes (Signed)
RT called to patient room due to patient self extubating. Patient somewhat able to follow commands and talk. Vitals stable throughout.

## 2020-03-07 NOTE — Progress Notes (Signed)
Wasted 22mL of versed in stericycle with [place] Allen, RN

## 2020-03-07 NOTE — Progress Notes (Signed)
eLink Physician-Brief Progress Note Patient Name: Jared Kramer DOB: Mar 22, 1981 MRN: [id number]   Date of Service  03/07/2020  HPI/Events of Note  Patient self extubated. All sedation turned off. Sat = 97% and RR = 21. He remains lethargic.   eICU Interventions  Plan: 1. Will monitor extubated.  2. Will ask ground team to evaluate the patient at bedside. He might do well if he wakes up and becomes more interactive.      Intervention Category Major Interventions: Respiratory failure - evaluation and management  Blade Scheff Eugene 03/07/2020, 6:19 AM

## 2020-03-07 NOTE — Progress Notes (Addendum)
NAME:  Jared Kramer, MRN:  [id number], DOB:  06-Jan-1981, LOS: 2 ADMISSION DATE:  03/05/2020, CONSULTATION DATE: 03/05/20 REFERRING MD:  EDP, CHIEF COMPLAINT:  Seizures   Brief History   Florentino Laabs is a 39 y.o male with a history of several foreign body ingestions (batteries and razors), mixed personality disorder, and factious disorder who presented to [facility] on 4/1 after a drug overdose (UDS + cocaine and TCA). He was subsequently intubated for airway protection. On 4/4 he experience 2 grand-mal seizures and was transferred to [facility] for prolonged EEG.   Past Medical History  Factious Disorder  Mixed personality disorder  Foreign body ingestion (razors, batteries) Polysubstance use disorder (Benzos, opiates) Hepatitis C HTN  Significant Hospital Events   4/1: Intubated in ED for airway protection 4/2: Admitted to ICU at [facility]  4/4: 2 grand mal seizures and transferred to [facility] 4/5: No more seizures on EEG, versed weaned off 4/6: Self extubated  Consults:  Neurology   Procedures:  4/1: Intubation   Significant Diagnostic Tests:  4/1: Chest x-ray>>Endotracheal tube terminates 4 cm above the carina. Lungs are clear.  No pleural effusion or pneumothorax.The heart is normal in size.Old right posterior rib fracture deformities.Enteric tube courses into the stomach. 4/1: Abdominal 1 view x-ray>>Enteric tube terminates in the proximal gastric body. Linear radiopaque foreign body overlies the right upper abdomen 4/2: CT head without contrast>>No acute intracranial abnormality noted. Minimal air-fluid level within the left maxillary antrum. 4/4: CXR>> New left basilar infiltrative opacity. 4/4: DG Abd 2 view>> Multiple radiopaque foreign bodies present, likely either within small bowel or transverse colon. Suspected portion of needle in left mid abdomen, likely in jejunum. No bowel obstruction or free air evident. Nasogastric tube tip and side port in stomach. Lung bases clear. 4/4 CT  abd/pelvis>> Bilateral lower lobe pneumonia with dense consolidation on the left. The pattern suggests aspiration. Over distended bladder leading to bilateral Hydroureteronephrosis. Multiple intra-abdominal and subcutaneous metallic foreign bodies without significant change from January 2021. No visible bowel perforation or obstruction.  Micro Data:  SARS-CoV-2 PCR 4/1>> negative Influenza PCR 4/1>> negative Respiratory cultures 4/4 >> gram-positive rods  Antimicrobials:  Unasyn 4/4>> current  Interim history/subjective:   Self extubated today morning.  Precedex and fentanyl drip stopped at 1 AM Patient remains somnolent but arousable. Snoring with sinus tachycardia. Febrile to 102.2  Objective   Blood pressure (!) 157/102, pulse (!) 154, temperature (!) 102.2 F (39 C), temperature source Axillary, resp. rate (!) 24, weight 88.2 kg, SpO2 99 %.    Vent Mode: PRVC FiO2 (%):  [30 %] 30 % Set Rate:  [20 bmp] 20 bmp Vt Set:  [580 mL] 580 mL PEEP:  [5 cmH20] 5 cmH20 Plateau Pressure:  [8 cmH20-18 cmH20] 8 cmH20   Intake/Output Summary (Last 24 hours) at 03/07/2020 0823 Last data filed at 03/07/2020 0800 Gross per 24 hour  Intake 4208.38 ml  Output 2075 ml  Net 2133.38 ml   Filed Weights   03/07/20 0500  Weight: 88.2 kg    Examination: Blood pressure (!) 157/102, pulse (!) 154, temperature (!) 102.2 F (39 C), temperature source Axillary, resp. rate (!) 24, weight 88.2 kg, SpO2 99 %. Gen:      No acute distress HEENT:  EOMI, sclera anicteric Neck:     No masses; no thyromegaly Lungs:    Clear to auscultation bilaterally; normal respiratory effort CV:         Regular rate and rhythm; no murmurs Abd:      +  bowel sounds; soft, non-tender; no palpable masses, no distension Ext:    No edema; adequate peripheral perfusion Skin:      Warm and dry; no rash Neuro: Sedated, arousable. Snoring with gurgling at back of throat  Creatinine 1.22,  PCT 0.63 Chest x-ray 4/6-left lower  lobe infiltrate.  small effusion.  Resolved Hospital Problem list   N/A  Assessment & Plan:  Acute hypoxic respiratory failure due to polysubstance overdose Intubated for airway protection due to drug overdose Self extubated today morning He is very somnolent with gurgling at the back but arousable. Tenuous respiratory status but protecting airway for now Continue monitoring closely.  Hope that he becomes more alert as sedation is off  Sinus tachycardia Likely secondary to fevers. IV Tylenol  Lower lobe airspace disease seen on CT, aspiration pneumonia Continue antibiotics as PCT is high and he is febrile. IV Tylenol for fevers.  Acute metabolic encephalopathy secondary to drug overdose Unsure if intentional vs. Unintentional Urine drug screen is positive for cocaine and tricyclics Ethyl alcohol < 10 Salicylates & Acetaminophen: Undetectable levels Monitor QTc closely in setting of TCA overdose  Psych consult when he is more awake. Currently involuntarily committed     Seizure like activity  Keppra per neuro, EEG monitoring. Hold klonopin due to altered MS and NPO status.   Foreign body ingestion CT abdomen and pelvis noncontrasted showed multiple foreign objects General surgery consulted at [facility] reviewed outside records and felt that these foreign bodies were stable from prior episodes and sutures. Recommended against surgical intervention at this point.    Urinary retention CT Abd/Pelvis with severally distended bladder.  Possible secondary to medications  Maintain foley   Best practice:  Diet: Tube feeds on hold Pain/Anxiety/Delirium protocol (if indicated):On hold VAP protocol (if indicated): Per order set DVT prophylaxis: Lovenox GI prophylaxis: Famotidine Glucose control: CBG and SSI every 4 hours  Mobility: Bed rest Code Status: Full Family Communication: Father Onalee Hua updated 4/5 Disposition: ICU  Critical care time:   The patient is critically ill with  multiple organ system failure and requires high complexity decision making for assessment and support, frequent evaluation and titration of therapies, advanced monitoring, review of radiographic studies and interpretation of complex data.   Critical Care Time devoted to patient care services, exclusive of separately billable procedures, described in this note is 35 minutes.   Chilton Greathouse MD [place] Please see [email or web address] for pager details.  03/07/2020, 8:23 AM

## 2020-03-07 NOTE — Progress Notes (Signed)
Patient admitted for status with seizure activity stopping yesterday.  Asked to evaluate after self extubation.  Precedex and fentanyl immediately stopped. Remains somulent but somewhat arousable with stimulation.  ABG shows pH of 7.33, on 3 liters nasal cannula with monitor sat of 98%, 86 % by abg.   Some secretions in back of throat easily suctioned.   Will monitor for now.  If necessary, BIPAP could be used as a bridge until sedation has worn off.

## 2020-03-07 NOTE — Progress Notes (Signed)
200 mL fentanyl wasted in sink w/ Milta Deiters RN

## 2020-03-07 NOTE — Procedures (Addendum)
Patient Name: Jared Kramer  MRN: [id number]  Epilepsy Attending: Charlsie Quest  Referring Physician/Provider: Dr. Caryl Pina Duration: 03/06/2020 1630 to 03/07/2020 1040  Patient history: 39 year old male with new onset generalized tonic-clonic seizures in the setting of drug overdose (UDS positive for cocaine and tricyclic antidepressant).  EEG to evaluate for seizures.  Level of alertness: Comatose/sedated  AEDs during EEG study: Keppra  Technical aspects: This EEG study was done with scalp electrodes positioned according to the 10-20 International system of electrode placement. Electrical activity was acquired at a sampling rate of 500Hz  and reviewed with a high frequency filter of 70Hz  and a low frequency filter of 1Hz . EEG data were recorded continuously and digitally stored.   DESCRIPTION:  EEG initially showed continuous generalized 2-3hz  delta slowing with overriding 15 to 18 Hz beta activity distributed symmetrically and diffusely. Gradually as sedation was weaned off, EEG showed generalized polymorphic 3-6Hz  theta-delta slowing. Hyperventilation and photic stimulation were not performed.  ABNORMALITY - Continuous slow, generalized - Excessive beta, generalized    IMPRESSION: This study was initially suggestive of severe diffuse encephalopathy which gradually improved to moderate diffuse encephalopathy, non specific to etiology but most likely secondary to sedation. No seizures or epileptiform discharges were seen throughout the recording.  Melani Brisbane 

## 2020-03-08 ENCOUNTER — Inpatient Hospital Stay (HOSPITAL_COMMUNITY): Payer: Medicaid Other

## 2020-03-08 DIAGNOSIS — G9340 Encephalopathy, unspecified: Secondary | ICD-10-CM

## 2020-03-08 DIAGNOSIS — O161 Unspecified maternal hypertension, first trimester: Secondary | ICD-10-CM

## 2020-03-08 DIAGNOSIS — T189XXA Foreign body of alimentary tract, part unspecified, initial encounter: Secondary | ICD-10-CM

## 2020-03-08 LAB — BASIC METABOLIC PANEL
Anion gap: 13 (ref 5–15)
BUN: 12 mg/dL (ref 6–20)
CO2: 19 mmol/L — ABNORMAL LOW (ref 22–32)
Calcium: 8.4 mg/dL — ABNORMAL LOW (ref 8.9–10.3)
Chloride: 106 mmol/L (ref 98–111)
Creatinine, Ser: 0.84 mg/dL (ref 0.61–1.24)
GFR calc Af Amer: >60 mL/min (ref >60)
GFR calc non Af Amer: >60 mL/min (ref >60)
Glucose, Bld: 109 mg/dL — ABNORMAL HIGH (ref 70–99)
Potassium: 3.3 mmol/L — ABNORMAL LOW (ref 3.5–5.1)
Sodium: 138 mmol/L (ref 135–145)

## 2020-03-08 LAB — CBC
HCT: 37.4 % — ABNORMAL LOW (ref 39.0–52.0)
Hemoglobin: 12 g/dL — ABNORMAL LOW (ref 13.0–17.0)
MCH: 28.3 pg (ref 26.0–34.0)
MCHC: 32.1 g/dL (ref 30.0–36.0)
MCV: 88.2 fL (ref 80.0–100.0)
Platelets: 209 10*3/uL (ref 150–400)
RBC: 4.24 MIL/uL (ref 4.22–5.81)
RDW: 14.9 % (ref 11.5–15.5)
WBC: 12.3 10*3/uL — ABNORMAL HIGH (ref 4.0–10.5)
nRBC: 0 % (ref 0.0–0.2)

## 2020-03-08 LAB — PHOSPHORUS: Phosphorus: 3.1 mg/dL (ref 2.5–4.6)

## 2020-03-08 LAB — MAGNESIUM: Magnesium: 1.6 mg/dL — ABNORMAL LOW (ref 1.7–2.4)

## 2020-03-08 LAB — PROCALCITONIN: Procalcitonin: 0.41 ng/mL

## 2020-03-08 MED ORDER — SODIUM CHLORIDE 0.9 % IV SOLN: INTRAVENOUS | Status: DC

## 2020-03-08 MED ORDER — CYPROHEPTADINE HCL 4 MG PO TABS
2.0000 mg | ORAL_TABLET | Freq: Once | ORAL | Status: AC
  Administered 2020-03-08: 14:00:00 2 mg via ORAL
  Filled 2020-03-08: qty 1

## 2020-03-08 MED ORDER — MIRTAZAPINE 15 MG PO TABS
15.0000 mg | ORAL_TABLET | Freq: Every day | ORAL | Status: DC
  Administered 2020-03-08 – 2020-03-09 (×2): 15 mg via ORAL
  Filled 2020-03-08 (×2): qty 1

## 2020-03-08 MED ORDER — FOLIC ACID 5 MG/ML IJ SOLN
1.0000 mg | Freq: Every day | INTRAMUSCULAR | Status: DC
  Administered 2020-03-08: 11:00:00 1 mg via INTRAVENOUS
  Filled 2020-03-08 (×4): qty 0.2

## 2020-03-08 MED ORDER — FOLIC ACID 1 MG PO TABS
1.0000 mg | ORAL_TABLET | Freq: Every day | ORAL | Status: DC
  Administered 2020-03-09 – 2020-03-11 (×3): 1 mg via ORAL
  Filled 2020-03-08 (×3): qty 1

## 2020-03-08 MED ORDER — ENSURE ENLIVE PO LIQD
237.0000 mL | Freq: Two times a day (BID) | ORAL | Status: DC
  Administered 2020-03-08 – 2020-03-11 (×5): 237 mL via ORAL

## 2020-03-08 MED ORDER — THIAMINE HCL 100 MG/ML IJ SOLN
100.0000 mg | Freq: Every day | INTRAMUSCULAR | Status: DC
  Administered 2020-03-08: 100 mg via INTRAVENOUS
  Filled 2020-03-08: qty 2

## 2020-03-08 MED ORDER — ACETAMINOPHEN 325 MG PO TABS
650.0000 mg | ORAL_TABLET | ORAL | Status: DC | PRN
  Administered 2020-03-09 – 2020-03-10 (×3): 650 mg via ORAL
  Filled 2020-03-08 (×3): qty 2

## 2020-03-08 MED ORDER — HYDRALAZINE HCL 20 MG/ML IJ SOLN
INTRAMUSCULAR | Status: AC
  Filled 2020-03-08: qty 1

## 2020-03-08 MED ORDER — HYDRALAZINE HCL 20 MG/ML IJ SOLN
10.0000 mg | INTRAMUSCULAR | Status: DC | PRN
  Administered 2020-03-08 – 2020-03-10 (×3): 10 mg via INTRAVENOUS
  Filled 2020-03-08 (×2): qty 1

## 2020-03-08 MED ORDER — BUPRENORPHINE HCL-NALOXONE HCL 8-2 MG SL SUBL
1.0000 | SUBLINGUAL_TABLET | Freq: Two times a day (BID) | SUBLINGUAL | Status: DC
  Administered 2020-03-08 – 2020-03-11 (×6): 1 via SUBLINGUAL
  Filled 2020-03-08 (×6): qty 1

## 2020-03-08 MED ORDER — THIAMINE HCL 100 MG PO TABS
100.0000 mg | ORAL_TABLET | Freq: Every day | ORAL | Status: DC
  Administered 2020-03-09 – 2020-03-11 (×3): 100 mg via ORAL
  Filled 2020-03-08 (×3): qty 1

## 2020-03-08 MED ORDER — NICARDIPINE HCL IN NACL 20-0.86 MG/200ML-% IV SOLN
3.0000 mg/h | INTRAVENOUS | Status: DC
  Administered 2020-03-08: 03:00:00 3 mg/h via INTRAVENOUS
  Administered 2020-03-08 (×2): 10 mg/h via INTRAVENOUS
  Administered 2020-03-08: 5 mg/h via INTRAVENOUS
  Administered 2020-03-08: 7 mg/h via INTRAVENOUS
  Administered 2020-03-08: 12.5 mg/h via INTRAVENOUS
  Administered 2020-03-08: 10 mg/h via INTRAVENOUS
  Administered 2020-03-08: 5 mg/h via INTRAVENOUS
  Administered 2020-03-09: 02:00:00 4 mg/h via INTRAVENOUS
  Filled 2020-03-08: qty 400
  Filled 2020-03-08 (×8): qty 200

## 2020-03-08 MED ORDER — CYPROHEPTADINE HCL 4 MG PO TABS
2.0000 mg | ORAL_TABLET | Freq: Once | ORAL | Status: DC
  Filled 2020-03-08: qty 1

## 2020-03-08 MED ORDER — POTASSIUM CHLORIDE 10 MEQ/50ML IV SOLN
10.0000 meq | INTRAVENOUS | Status: AC
  Administered 2020-03-08 (×4): 10 meq via INTRAVENOUS
  Filled 2020-03-08 (×4): qty 50

## 2020-03-08 MED ORDER — MAGNESIUM SULFATE 2 GM/50ML IV SOLN
2.0000 g | Freq: Once | INTRAVENOUS | Status: AC
  Administered 2020-03-08: 10:00:00 2 g via INTRAVENOUS
  Filled 2020-03-08: qty 50

## 2020-03-08 NOTE — Evaluation (Signed)
Clinical/Bedside Swallow Evaluation Patient Details  Name: Jared Kramer MRN: [id number] Date of Birth: 11-04-1981  Today's Date: 03/08/2020 Time: SLP Start Time (ACUTE ONLY): 1319 SLP Stop Time (ACUTE ONLY): 1334 SLP Time Calculation (min) (ACUTE ONLY): 15 min  Past Medical History: No past medical history on file. Past Surgical History:  Past Surgical History:  Procedure Laterality Date  . FOREIGN BODY REMOVAL  10/19/2019   HPI:  Pt is a 39yo male who presented to [facility] 4/1 after drug overdose (UDS + cocaine and TCA). He was intubated in the ED 4/1; self0extubated 4/6. On 4/4 he had 2 grand mal seizures and was subsequently transferred to [facility]. PMH includes: several foreign body ingestions, mixed personality disorder, factitious disorder, Hep C, HTN, polysubstance abuse   Assessment / Plan / Recommendation Clinical Impression  Pt is drowsy and exhibit signs of dysphagia that are likely post-extubation in nature, given several days of intubation as well as self-extubation. He has multiple subswallows per bolus, but with increasing amounts after thin liquid trials. A cough was noted after trying to drink three ounces of water consecutively, raising suspicion for decreased airway protection. Given the above as well as his mentation and mild dysphonia, recommend starting with a full liquid diet thickened to nectar thick. SLP will f/u for potential to progress versus need for instrumental testing.   SLP Visit Diagnosis: Dysphagia, unspecified (R13.10)    Aspiration Risk  Mild aspiration risk;Moderate aspiration risk    Diet Recommendation Nectar-thick liquid   Liquid Administration via: Cup;Straw Medication Administration: Crushed with puree Supervision: Staff to assist with self feeding;Full supervision/cueing for compensatory strategies Compensations: Minimize environmental distractions;Slow rate;Small sips/bites Postural Changes: Seated upright at 90 degrees;Remain upright for at least  30 minutes after po intake    Other  Recommendations Oral Care Recommendations: Oral care BID Other Recommendations: Order thickener from pharmacy;Prohibited food (jello, ice cream, thin soups);Remove water pitcher   Follow up Recommendations (tba)      Frequency and Duration min 2x/week  2 weeks       Prognosis Prognosis for Safe Diet Advancement: Good      Swallow Study   General HPI: Pt is a 39yo male who presented to [facility] 4/1 after drug overdose (UDS + cocaine and TCA). He was intubated in the ED 4/1; self0extubated 4/6. On 4/4 he had 2 grand mal seizures and was subsequently transferred to [facility]. PMH includes: several foreign body ingestions, mixed personality disorder, factitious disorder, Hep C, HTN, polysubstance abuse Type of Study: Bedside Swallow Evaluation Previous Swallow Assessment: none in chart Diet Prior to this Study: NPO Temperature Spikes Noted: Yes(102.2) Respiratory Status: Nasal cannula History of Recent Intubation: Yes Length of Intubations (days): 5 days Date extubated: 03/07/20 Behavior/Cognition: Lethargic/Drowsy;Cooperative;Requires cueing Oral Cavity Assessment: Dry Oral Care Completed by SLP: No Oral Cavity - Dentition: Adequate natural dentition Self-Feeding Abilities: Needs assist Patient Positioning: Upright in bed Baseline Vocal Quality: Low vocal intensity(mild) Volitional Cough: Strong Volitional Swallow: Able to elicit    Oral/Motor/Sensory Function Overall Oral Motor/Sensory Function: Within functional limits   Ice Chips Ice chips: Within functional limits Presentation: Spoon   Thin Liquid Thin Liquid: Impaired Presentation: Cup;Spoon;Straw Pharyngeal  Phase Impairments: Multiple swallows;Cough - Delayed    Nectar Thick Nectar Thick Liquid: Impaired Presentation: Straw Pharyngeal Phase Impairments: Multiple swallows   Honey Thick Honey Thick Liquid: Not tested   Puree Puree: Impaired Presentation: Spoon Pharyngeal Phase  Impairments: Multiple swallows   Solid     Solid: Not tested  Osie Bond., M.A. [place] Pager [phone] Office [phone]  03/08/2020,3:24 PM

## 2020-03-08 NOTE — Progress Notes (Signed)
Subjective: No acute events overnight.  Continues to be hypertensive, tachycardic and febrile.  This morning patient states he took some medications that he was not prescribed but is not sure what medications those were.  Denies any alcohol use prior to admission.  States that he does drink occasionally.  ROS: negative except above  Examination  Vital signs in last 24 hours: Temp:  [98.5 F (36.9 C)-101.6 F (38.7 C)] 100.6 F (38.1 C) (04/07 0800) Pulse Rate:  [91-126] 111 (04/07 1000) Resp:  [18-46] 40 (04/07 1000) BP: (129-188)/(53-111) 143/77 (04/07 1000) SpO2:  [91 %-100 %] 94 % (04/07 1000) Weight:  [87.8 kg] 87.8 kg (04/07 0500)  General: lying in bed, not in apparent distress CVS: pulse-normal rate and rhythm RS: breathing comfortably, CTA B Extremities: normal, warm  Neuro: MS: Alert, oriented, follows simple commands CN: pupils equal and reactive,  EOMI, face symmetric, tongue midline, normal sensation over face Motor: Antigravity strength in all 4 extremities Reflexes: 2+ bilaterally over patella, biceps, plantars: flexor   Basic Metabolic Panel: Recent Labs  Lab 03/05/20 0411 03/05/20 1620 03/05/20 1635 03/05/20 1635 03/06/20 0724 03/06/20 1813 03/07/20 0509 03/07/20 0636 03/07/20 1756 03/08/20 0526  NA 143   < > 142  --  144  --  141 142  --  138  K 3.4*   < > 3.7  --  3.1*  --  4.1 4.1  --  3.3*  CL 113*  --  111  --  116*  --  111  --   --  106  CO2 25  --  25  --  20*  --  22  --   --  19*  GLUCOSE 113*  --  89  --  75  --  110*  --   --  109*  BUN 12  --  8  --  9  --  14  --   --  12  CREATININE 1.17  --  1.16  --  1.03  --  1.22  --   --  0.84  CALCIUM 8.3*  --  8.0*   < > 6.9*  --  7.8*  --   --  8.4*  MG 1.8  --   --   --   --  1.8 1.8  --  1.7 1.6*  PHOS 3.8  --   --   --   --  3.0 2.9  --  4.4 3.1   < > = values in this interval not displayed.    CBC: Recent Labs  Lab 03/02/20 2254 03/02/20 2254 03/03/20 0705 03/03/20 0705  03/05/20 0411 03/05/20 0411 03/05/20 1620 03/06/20 0724 03/07/20 0509 03/07/20 0636 03/08/20 0526  WBC 8.4   < > 5.2  --  16.3*  --   --  12.8* 10.2  --  12.3*  NEUTROABS 6.1  --   --   --   --   --   --   --   --   --   --   HGB 14.3   < > 13.0   < > 12.7*   < > 11.2* 10.9* 10.7* 10.9* 12.0*  HCT 44.9   < > 41.0   < > 41.3   < > 33.0* 35.5* 35.3* 32.0* 37.4*  MCV 87.0   < > 88.0  --  89.6  --   --  89.9 92.2  --  88.2  PLT 206   < > PLATELET CLUMPS NOTED ON  SMEAR, UNABLE TO ESTIMATE  --  146*  --   --  113* 158  --  209   < > = values in this interval not displayed.     Coagulation Studies: No results for input(s): LABPROT, INR in the last 72 hours.  Imaging No brain imaging overnight   ASSESSMENT AND PLAN: 39 year old male presented with new onset generalized tonic-clonic seizures in setting of drug overdose (UDS positive for cocaine and tricyclic antidepressant).  Acute toxic-metabolic encephalopathy New onset seizures Cocaine use disorder Suspected serotonin syndrome Anemia Hypocalcemia Hypertension Tachycardia Fever -Patient states he took some medications that he was not prescribed but is unable to tell me the name of the medications.  He was on psychiatric medications as well as  Suboxone. He is hypertensive, tachycardic, febrile and was hyperreflexic in bilateral lower extremities till 03/07/2020.  No clear source of infection.  Suspect overdose and exam findings concerning for serotonin syndrome  Recommendations -Continue lorazepam 2 mg 3 times daily for agitation and symptomatic management of serotonin syndrome.  Can be switched to p.o. Klonopin once patient has p.o. access -Continue cyproheptadine 2 mg daily for empiric management of serotonin syndrome  - Continue Keppra 1000 mg twice daily,will likely not require long-term if this presentation was likely secondary to provoked seizure. -Speech evaluation, confirm with pharmacy and resume home medications slowly  once patient is able to tolerate p.o. -Goal SBP less than 160, as needed labetalol -Cannot obtain MRI brain as patient has metallic foreign objects -Continue management of rest of the comorbidities per primary team   CRITICAL CARE Performed by: Charlsie Quest   Total critical care time:53minutes  Critical care time was exclusive of separately billable procedures and treating other patients.  Critical care was necessary to treat or prevent imminent or life-threatening deterioration.  Critical care was time spent personally by me on the following activities: development of treatment plan with patient and/or surrogate as well as nursing, discussions with consultants, evaluation of patient's response to treatment, examination of patient, obtaining history from patient or surrogate, ordering and performing treatments and interventions, ordering and review of laboratory studies, ordering and review of radiographic studies, pulse oximetry and re-evaluation of patient's condition.

## 2020-03-08 NOTE — Consult Note (Signed)
[facility]   Reason for Consult:  "suicidal behaviors, multiple foreign body ingestions, drug overdose" Referring Physician:  Dr. Isaiah SergeMannam Patient Identification: Jared Kramer Card MRN:  [id number] Principal Diagnosis: <principal problem not specified> Diagnosis:  Active Problems:   Drug overdose   Elevated blood pressure affecting pregnancy in first trimester, antepartum   Acute encephalopathy   Swallowed foreign body   Total Time spent with patient: 30 minutes  Subjective:   Jared Kramer Carrigan is a 39 y.o. male patient.  Patient assessed by nurse practitioner.  Patient alert and oriented, answers appropriately. Patient found resting, sitter at bedside. Patient states "they said I took some pills but I do not remember taking them, they said they family having a seizure." Patient denies suicidal and homicidal ideations.  Patient reports history of suicide attempts.  Patient denies auditory visual hallucinations.  Patient denies symptoms of paranoia. Patient reports he lives with his father, Vallery RidgeDavid Yarborough.  Patient denies access to weapons.  Patient reports appetite and sleep are "pretty good."  Patient reports he is currently receiving disability related to his mental health diagnosis.  Patient reports he is seen by outpatient psychiatry [facility].  Patient unable to articulate home medications, states that his father assist him with medication management.  Patient reports compliance with home medications.  Patient reports use of alcohol and marijuana "once in a while."  Patient denies use of substance aside from marijuana.  Patient UDS positive for cocaine and marijuana. Patient gives verbal consent to call his Father, Vallery RidgeDavid Yarborough.  Patient gives verbal consent to phone his brother, Evelina BucyDavid Yarborough Jr.  Left message, unable to reach family member for collateral information at this time.  HPI: Patient admitted for drug overdose.  Past Psychiatric History:  Benzodiazepine dependence, mixed personality disorder, opioid use disorder, factitious disorder  Risk to Self:  Denies Risk to Others:  Denies Prior Inpatient Therapy:  Yes Prior Outpatient Therapy:  Yes  Past Medical History: No past medical history on file.  Past Surgical History:  Procedure Laterality Date  . FOREIGN BODY REMOVAL  10/19/2019   Family History:  Family History  Problem Relation Age of Onset  . Cancer Mother   . Hepatitis Father    Family Psychiatric  History: Denies Social History:  Social History   Substance and Sexual Activity  Alcohol Use Yes     Social History   Substance and Sexual Activity  Drug Use Yes    Social History   Socioeconomic History  . Marital status: Single    Spouse name: Not on file  . Number of children: Not on file  . Years of education: Not on file  . Highest education level: Not on file  Occupational History  . Not on file  Tobacco Use  . Smoking status: Not on file  Substance and Sexual Activity  . Alcohol use: Yes  . Drug use: Yes  . Sexual activity: Not on file  Other Topics Concern  . Not on file  Social History Narrative  . Not on file   Social Determinants of Health   Financial Resource Strain:   . Difficulty of Paying Living Expenses:   Food Insecurity:   . Worried About Programme researcher, broadcasting/film/videounning Out of Food in the Last Year:   . Baristaan Out of Food in the Last Year:   Transportation Needs:   . Freight forwarderLack of Transportation (Medical):   Marland Kitchen. Lack of Transportation (Non-Medical):   Physical Activity:   . Days of Exercise per  Week:   . Minutes of Exercise per Session:   Stress:   . Feeling of Stress :   Social Connections:   . Frequency of Communication with Friends and Family:   . Frequency of Social Gatherings with Friends and Family:   . Attends Religious Services:   . Active Member of Clubs or Organizations:   . Attends Banker Meetings:   Marland Kitchen Marital Status:    Additional Social History:    Allergies:    Allergies  Allergen Reactions  . Ketamine Other (See Comments)    Laryngospasm October 2020 Ketamine 4mg /kg IM given  . Vancomycin Shortness Of Breath, Palpitations and Other (See Comments)    Stops breathing!!!!   . Fish-Derived Products Rash  . Carbamazepine Other (See Comments)    Reaction not noted  . Haloperidol Lactate Other (See Comments)    Dystonia (per pt)  . Famotidine Swelling, Rash and Other (See Comments)    IV- Arm became swollen   . Fentanyl Nausea And Vomiting  . Ibuprofen Other (See Comments)    Bleeding; tolerates ketorolac   . Latex Rash and Other (See Comments)    "It breaks me out badly"  . Povidone-Iodine Rash  . Shellfish-Derived Products Rash    Labs:  Results for orders placed or performed during the hospital encounter of 03/05/20 (from the past 48 hour(Kramer))  Magnesium     Status: None   Collection Time: 03/06/20  6:13 PM  Result Value Ref Range   Magnesium 1.8 1.7 - 2.4 mg/dL    Comment: Performed at [facility], [address], [place], [place]  Phosphorus     Status: None   Collection Time: 03/06/20  6:13 PM  Result Value Ref Range   Phosphorus 3.0 2.5 - 4.6 mg/dL    Comment: Performed at [facility], [address] [place], [place]  Basic metabolic panel     Status: Abnormal   Collection Time: 03/07/20  5:09 AM  Result Value Ref Range   Sodium 141 135 - 145 mmol/L   Potassium 4.1 3.5 - 5.1 mmol/L   Chloride 111 98 - 111 mmol/L   CO2 22 22 - 32 mmol/L   Glucose, Bld 110 (H) 70 - 99 mg/dL    Comment: Glucose reference range applies only to samples taken after fasting for at least 8 hours.   BUN 14 6 - 20 mg/dL   Creatinine, Ser 05/07/20 0.61 - 1.24 mg/dL   Calcium 7.8 (L) 8.9 - 10.3 mg/dL   GFR calc non Af Amer >60 >60 mL/min   GFR calc Af Amer >60 >60 mL/min   Anion gap 8 5 - 15    Comment: Performed at [facility], [address] [place], [place]  CBC     Status: Abnormal   Collection  Time: 03/07/20  5:09 AM  Result Value Ref Range   WBC 10.2 4.0 - 10.5 K/uL   RBC 3.83 (L) 4.22 - 5.81 MIL/uL   Hemoglobin 10.7 (L) 13.0 - 17.0 g/dL   HCT 05/07/20 (L) 32.2 - 02.5 %   MCV 92.2 80.0 - 100.0 fL   MCH 27.9 26.0 - 34.0 pg   MCHC 30.3 30.0 - 36.0 g/dL   RDW 42.7 06.2 - 37.6 %   Platelets 158 150 - 400 K/uL   nRBC 0.0 0.0 - 0.2 %    Comment: Performed at [facility], [address]  [place], [place] [address]  Procalcitonin     Status: None   Collection Time: 03/07/20  5:09 AM  Result Value Ref Range   Procalcitonin 0.63 ng/mL    Comment:        Interpretation: PCT > 0.5 ng/mL and <= 2 ng/mL: Systemic infection (sepsis) is possible, but other conditions are known to elevate PCT as well. (NOTE)       Sepsis PCT Algorithm           Lower Respiratory Tract                                      Infection PCT Algorithm    ----------------------------     ----------------------------         PCT < 0.25 ng/mL                PCT < 0.10 ng/mL         Strongly encourage             Strongly discourage   discontinuation of antibiotics    initiation of antibiotics    ----------------------------     -----------------------------       PCT 0.25 - 0.50 ng/mL            PCT 0.10 - 0.25 ng/mL               OR       >80% decrease in PCT            Discourage initiation of                                            antibiotics      Encourage discontinuation           of antibiotics    ----------------------------     -----------------------------         PCT >= 0.50 ng/mL              PCT 0.26 - 0.50 ng/mL                AND       <80% decrease in PCT             Encourage initiation of                                             antibiotics       Encourage continuation           of antibiotics    ----------------------------     -----------------------------        PCT >= 0.50 ng/mL                  PCT > 0.50 ng/mL               AND         increase in PCT                   Strongly encourage  initiation of antibiotics    Strongly encourage escalation           of antibiotics                                     -----------------------------                                           PCT <= 0.25 ng/mL                                                 OR                                        > 80% decrease in PCT                                     Discontinue / Do not initiate                                             antibiotics Performed at [facility], [address], [place], [place] [address]   Magnesium     Status: None   Collection Time: 03/07/20  5:09 AM  Result Value Ref Range   Magnesium 1.8 1.7 - 2.4 mg/dL    Comment: Performed at [facility] [address] [place], [place] [address]  Phosphorus     Status: None   Collection Time: 03/07/20  5:09 AM  Result Value Ref Range   Phosphorus 2.9 2.5 - 4.6 mg/dL    Comment: Performed at [facility] [address] [place], [place] [address]  I-STAT 7, (LYTES, BLD GAS, ICA, H+H)     Status: Abnormal   Collection Time: 03/07/20  6:36 AM  Result Value Ref Range   pH, Arterial 7.327 (L) 7.350 - 7.450   pCO2 arterial 39.6 32.0 - 48.0 mmHg   pO2, Arterial 56.0 (L) 83.0 - 108.0 mmHg   Bicarbonate 20.5 20.0 - 28.0 mmol/L   TCO2 22 22 - 32 mmol/L   O2 Saturation 85.0 %   Acid-base deficit 5.0 (H) 0.0 - 2.0 mmol/L   Sodium 142 135 - 145 mmol/L   Potassium 4.1 3.5 - 5.1 mmol/L   Calcium, Ion 1.27 1.15 - 1.40 mmol/L   HCT 32.0 (L) 39.0 - 52.0 %   Hemoglobin 10.9 (L) 13.0 - 17.0 g/dL   Patient temperature 99.9 F    Collection site RADIAL, Naftuli Dalsanto'Kramer TEST ACCEPTABLE    Drawn by RT    Sample type ARTERIAL   Magnesium     Status: None   Collection Time: 03/07/20  5:56 PM  Result Value Ref Range   Magnesium 1.7 1.7 - 2.4 mg/dL    Comment: Performed at [facility], [place] [address], [place], [place] [address]  Phosphorus     Status:  None    Collection Time: 03/07/20  5:56 PM  Result Value Ref Range   Phosphorus 4.4 2.5 - 4.6 mg/dL    Comment: Performed at [facility], [address] [place], [place] [address]  Basic metabolic panel     Status: Abnormal   Collection Time: 03/08/20  5:26 AM  Result Value Ref Range   Sodium 138 135 - 145 mmol/L   Potassium 3.3 (L) 3.5 - 5.1 mmol/L   Chloride 106 98 - 111 mmol/L   CO2 19 (L) 22 - 32 mmol/L   Glucose, Bld 109 (H) 70 - 99 mg/dL    Comment: Glucose reference range applies only to samples taken after fasting for at least 8 hours.   BUN 12 6 - 20 mg/dL   Creatinine, Ser 1.19 0.61 - 1.24 mg/dL   Calcium 8.4 (L) 8.9 - 10.3 mg/dL   GFR calc non Af Amer >60 >60 mL/min   GFR calc Af Amer >60 >60 mL/min   Anion gap 13 5 - 15    Comment: Performed at [facility], [address] [place], [place] [address]  CBC     Status: Abnormal   Collection Time: 03/08/20  5:26 AM  Result Value Ref Range   WBC 12.3 (H) 4.0 - 10.5 K/uL   RBC 4.24 4.22 - 5.81 MIL/uL   Hemoglobin 12.0 (L) 13.0 - 17.0 g/dL   HCT 95.6 (L) 21.3 - 08.6 %   MCV 88.2 80.0 - 100.0 fL   MCH 28.3 26.0 - 34.0 pg   MCHC 32.1 30.0 - 36.0 g/dL   RDW 57.8 46.9 - 62.9 %   Platelets 209 150 - 400 K/uL   nRBC 0.0 0.0 - 0.2 %    Comment: Performed at [facility], [address] [place], [place] [address]  Procalcitonin     Status: None   Collection Time: 03/08/20  5:26 AM  Result Value Ref Range   Procalcitonin 0.41 ng/mL    Comment:        Interpretation: PCT (Procalcitonin) <= 0.5 ng/mL: Systemic infection (sepsis) is not likely. Local bacterial infection is possible. (NOTE)       Sepsis PCT Algorithm           Lower Respiratory Tract                                      Infection PCT Algorithm    ----------------------------     ----------------------------         PCT < 0.25 ng/mL                PCT < 0.10 ng/mL         Strongly encourage             Strongly discourage   discontinuation of  antibiotics    initiation of antibiotics    ----------------------------     -----------------------------       PCT 0.25 - 0.50 ng/mL            PCT 0.10 - 0.25 ng/mL               OR       >80% decrease in PCT            Discourage initiation of  antibiotics      Encourage discontinuation           of antibiotics    ----------------------------     -----------------------------         PCT >= 0.50 ng/mL              PCT 0.26 - 0.50 ng/mL               AND        <80% decrease in PCT             Encourage initiation of                                             antibiotics       Encourage continuation           of antibiotics    ----------------------------     -----------------------------        PCT >= 0.50 ng/mL                  PCT > 0.50 ng/mL               AND         increase in PCT                  Strongly encourage                                      initiation of antibiotics    Strongly encourage escalation           of antibiotics                                     -----------------------------                                           PCT <= 0.25 ng/mL                                                 OR                                        > 80% decrease in PCT                                     Discontinue / Do not initiate                                             antibiotics Performed at [facility], [address] [place], [place] [address]   Magnesium     Status: Abnormal   Collection Time: 03/08/20  5:26  AM  Result Value Ref Range   Magnesium 1.6 (L) 1.7 - 2.4 mg/dL    Comment: Performed at [facility], [address], [place], [place] [address]  Phosphorus     Status: None   Collection Time: 03/08/20  5:26 AM  Result Value Ref Range   Phosphorus 3.1 2.5 - 4.6 mg/dL    Comment: Performed at [facility], [address] [place], [place] [address]    Current Facility-Administered  Medications  Medication Dose Route Frequency Provider Last Rate Last Admin  . 0.9 %  sodium chloride infusion  75 mL/hr Intravenous Continuous Dellia Cloud, MD 75 mL/hr at 03/08/20 0700 75 mL/hr at 03/08/20 0700  . acetaminophen (TYLENOL) tablet 650 mg  650 mg Oral Q4H PRN Mannam, Praveen, MD      . Ampicillin-Sulbactam (UNASYN) 3 g in sodium chloride 0.9 % 100 mL IVPB  3 g Intravenous Q6H Mannam, Praveen, MD 200 mL/hr at 03/08/20 0906 3 g at 03/08/20 0906  . chlorhexidine (PERIDEX) 0.12 % solution 15 mL  15 mL Mouth Rinse BID Mannam, Praveen, MD   15 mL at 03/07/20 2200  . Chlorhexidine Gluconate Cloth 2 % PADS 6 each  6 each Topical Daily Mannam, Praveen, MD      . cyproheptadine (PERIACTIN) 4 MG tablet 2 mg  2 mg Oral Once Desai, Rahul P, PA-C      . enoxaparin (LOVENOX) injection 40 mg  40 mg Subcutaneous Q24H Helberg, Justin, MD   40 mg at 03/07/20 1507  . folic acid injection 1 mg  1 mg Intravenous Daily Desai, Rahul P, PA-C       Or  . folic acid (FOLVITE) tablet 1 mg  1 mg Oral Daily Desai, Rahul P, PA-C      . hydrALAZINE (APRESOLINE) injection 10 mg  10 mg Intravenous Q4H PRN Karl Ito, MD   10 mg at 03/08/20 0104  . labetalol (NORMODYNE) injection 20 mg  20 mg Intravenous Q2H PRN Mannam, Praveen, MD   20 mg at 03/08/20 0907  . levETIRAcetam (KEPPRA) IVPB 1000 mg/100 mL premix  1,000 mg Intravenous Q12H Levora Dredge, MD   Stopped at 03/08/20 [phone]  . LORazepam (ATIVAN) injection 2 mg  2 mg Intravenous TID Charlsie Quest, MD   2 mg at 03/08/20 0903  . magnesium sulfate IVPB 2 g 50 mL  2 g Intravenous Once Desai, Rahul P, PA-C 50 mL/hr at 03/08/20 1001 2 g at 03/08/20 1001  . MEDLINE mouth rinse  15 mL Mouth Rinse q12n4p Mannam, Praveen, MD   15 mL at 03/07/20 1746  . nicardipine (CARDENE) 20mg  in 0.86% saline IV infusion (0.1 mg/ml)  3-15 mg/hr Intravenous Continuous , MD 100 mL/hr at 03/08/20 0859 10 mg/hr at 03/08/20 0859  . ondansetron (ZOFRAN)  injection 4 mg  4 mg Intravenous Q6H PRN Helberg, Justin, MD      . potassium chloride 10 mEq in 50 mL *CENTRAL LINE* IVPB  10 mEq Intravenous Q1 Hr x 4 Desai, Rahul P, PA-C      . sodium chloride flush (NS) 0.9 % injection 10-40 mL  10-40 mL Intracatheter Q12H Mannam, Praveen, MD   30 mL at 03/07/20 2218  . sodium chloride flush (NS) 0.9 % injection 10-40 mL  10-40 mL Intracatheter PRN Mannam, Praveen, MD      . thiamine (B-1) injection 100 mg  100 mg Intravenous Daily Desai, Rahul P, PA-C  Or  . thiamine tablet 100 mg  100 mg Oral Daily Desai, Rahul P, PA-C        Musculoskeletal: Strength & Muscle Tone: within normal limits Gait & Station: Unable to assess Patient leans: N/A  Psychiatric Specialty Exam: Physical Exam  Nursing note and vitals reviewed. Constitutional: He is oriented to person, place, and time. He appears well-developed.  HENT:  Head: Normocephalic.  Cardiovascular: Normal rate.  Respiratory: Effort normal.  Neurological: He is alert and oriented to person, place, and time.  Psychiatric: He has a normal mood and affect. His speech is normal and behavior is normal. Thought content normal. Cognition and memory are normal. He expresses impulsivity.    Review of Systems  Constitutional: Negative.   HENT: Negative.   Eyes: Negative.   Respiratory: Negative.   Cardiovascular: Negative.   Gastrointestinal: Negative.   Genitourinary: Negative.   Musculoskeletal: Negative.   Skin: Negative.   Neurological: Negative.     Blood pressure (!) 143/77, pulse (!) 111, temperature (!) 100.6 F (38.1 C), temperature source Oral, resp. rate (!) 40, weight 87.8 kg, SpO2 94 %.Body mass index is 27.77 kg/m.  General Appearance: Casual and Fairly Groomed  Eye Contact:  Good  Speech:  Clear and Coherent and Normal Rate  Volume:  Normal  Mood:  Euthymic  Affect:  Appropriate and Congruent  Thought Process:  Coherent, Goal Directed and Descriptions of Associations: Intact   Orientation:  Full (Time, Place, and Person)  Thought Content:  Logical  Suicidal Thoughts:  No  Homicidal Thoughts:  No  Memory:  Immediate;   Good Recent;   Good Remote;   Good  Judgement:  Fair  Insight:  Fair  Psychomotor Activity:  Normal  Concentration:  Concentration: Good and Attention Span: Good  Recall:  Good  Fund of Knowledge:  Good  Language:  Good  Akathisia:  No  Handed:  Right  AIMS (if indicated):     Assets:  Communication Skills Desire for Improvement Financial Resources/Insurance Housing Intimacy Leisure Time Physical Health Resilience Social Support Talents/Skills  ADL'Kramer:  Intact  Cognition:  WNL  Sleep:        Treatment Plan Summary:  Case discussed with Dr. Lucianne Muss. Recommend consider Remeron 15mg  QHS.   Disposition: No evidence of imminent risk to self or others at present.   Patient does not meet criteria for psychiatric inpatient admission. Supportive therapy provided about ongoing stressors. Discussed crisis plan, support from social network, calling 911, coming to the Emergency Department, and calling Suicide Hotline.  , FNP 03/08/2020 10:30 AM

## 2020-03-08 NOTE — Progress Notes (Signed)
eLink Physician-Brief Progress Note Patient Name: Jared Kramer DOB: June 12, 1981 MRN: [id number]   Date of Service  03/08/2020  HPI/Events of Note  Hypertension - BP = 180/94. No improvement with Labetalol IV.   eICU Interventions  Will order: 1. Hydralazine 10 mg IV Q 4 hours PRN SBP > 160 or DBP > 100.      Intervention Category Major Interventions: Hypertension - evaluation and management  Abem Shaddix Eugene 03/08/2020, 12:51 AM

## 2020-03-08 NOTE — Progress Notes (Addendum)
NAME:  Jared Kramer, MRN:  [id number], DOB:  Jun 01, 1981, LOS: 3 ADMISSION DATE:  03/05/2020, CONSULTATION DATE: 03/05/20 REFERRING MD:  EDP, CHIEF COMPLAINT:  Seizures   Brief History   Jared Kramer is a 39 y.o male with a history of several foreign body ingestions (batteries and razors), mixed personality disorder, and factious disorder who presented to [facility] on 4/1 after a drug overdose (UDS + cocaine and TCA). He was subsequently intubated for airway protection. On 4/4 he experience 2 grand-mal seizures and was transferred to [facility] for prolonged EEG.   Past Medical History  Factious Disorder  Mixed personality disorder  Foreign body ingestion (razors, batteries) Polysubstance use disorder (Benzos, opiates) Hepatitis C HTN  Significant Hospital Events   4/1: Intubated in ED for airway protection 4/2: Admitted to ICU at [facility]  4/4: 2 grand mal seizures and transferred to [facility] 4/5: No more seizures on EEG, versed weaned off 4/6: Self extubated  Consults:  Neurology   Procedures:  4/1: Intubation (self extubated 4/6).  Significant Diagnostic Tests:  4/1: Chest x-ray>>Endotracheal tube terminates 4 cm above the carina. Lungs are clear.  No pleural effusion or pneumothorax.The heart is normal in size.Old right posterior rib fracture deformities.Enteric tube courses into the stomach. 4/1: Abdominal 1 view x-ray>>Enteric tube terminates in the proximal gastric body. Linear radiopaque foreign body overlies the right upper abdomen 4/2: CT head without contrast>>No acute intracranial abnormality noted. Minimal air-fluid level within the left maxillary antrum. 4/4: CXR>> New left basilar infiltrative opacity. 4/4: DG Abd 2 view>> Multiple radiopaque foreign bodies present, likely either within small bowel or transverse colon. Suspected portion of needle in left mid abdomen, likely in jejunum. No bowel obstruction or free air evident. Nasogastric tube tip and side port in stomach. Lung bases  clear. 4/4 CT abd/pelvis>> Bilateral lower lobe pneumonia with dense consolidation on the left. The pattern suggests aspiration. Over distended bladder leading to bilateral Hydroureteronephrosis. Multiple intra-abdominal and subcutaneous metallic foreign bodies without significant change from January 2021. No visible bowel perforation or obstruction.  Micro Data:  SARS-CoV-2 PCR 4/1>> negative Influenza PCR 4/1>> negative Respiratory cultures 4/4 >> gram-positive rods  Antimicrobials:  Unasyn 4/4>> current  Interim history/subjective:  Hypertensive despite labetalol and hydralazine; therefore, cardene started.  Objective   Blood pressure (!) 160/83, pulse (!) 117, temperature (!) 100.6 F (38.1 C), temperature source Oral, resp. rate (!) 36, weight 87.8 kg, SpO2 94 %.        Intake/Output Summary (Last 24 hours) at 03/08/2020 0936 Last data filed at 03/08/2020 0700 Gross per 24 hour  Intake 2468.67 ml  Output 3475 ml  Net -1006.33 ml   Filed Weights   03/07/20 0500 03/08/20 0500  Weight: 88.2 kg 87.8 kg    Examination: General: Adult male, resting in bed, in NAD. Neuro: Awake, follows basic commands, MAE's. HEENT: [place]/AT. Sclerae anicteric. EOMI. Cardiovascular: RRR, no M/R/G.  Lungs: Respirations even and unlabored.  CTA bilaterally, No W/R/R. Abdomen: BS x 4, soft, NT/ND.  Musculoskeletal: No gross deformities, no edema.  Skin: Numerous tattoos throughout.  Skin intact, warm, no rashes.   Assessment & Plan:   Acute hypoxic respiratory failure due to polysubstance overdose. Intubated for airway protection due to drug overdose - s/p self extubation 4/6. - Continue supplemental O2 as needed to maintain SpO2 > 92%.  Sinus tachycardia - Likely secondary to fevers/PNA vs withdrawal. - Monitor.  Hypertension. - Continue cardene for now.  Lower lobe airspace disease seen on CT, aspiration pneumonia -  Continue unasyn as PCT is high and he is febrile. - IV Tylenol for  fevers.  Acute metabolic encephalopathy secondary to drug overdose - Unclear if intentional vs. Unintentional.  UDS positive for cocaine and tricyclics.  EtOH, APAP, salicylates neg. - Monitor QTc closely in setting of TCA overdose.  - Empiric thiamine / folate. - Psych consult placed (currently IVC'd). - Suicide precautions until cleared by psych.  ? Serotonin syndrome - after TCA overdose.  S/p 1 dose of cyproheptadine. - Repeat cyproheptadine 2mg  x 1 and assess response. - Supportive care.   Seizure like activity. - Keppra and lorazepam per neuro, EEG monitoring. - Hold klonopin due to altered MS and NPO status.   Foreign body ingestion - CT abdomen and pelvis noncontrasted showed multiple foreign objects.  General surgery consulted at [facility] reviewed outside records and felt that these foreign bodies were stable from prior episodes. Recommended against surgical intervention at this point.  - Psych consult as above.   Urinary retention - CT Abd/Pelvis with severally distended bladder. Possible secondary to medications.  - Maintain foley.  Hypokalemia. Hypomagnesemia. - 4 runs K. - 2g Mag. - Follow BMP.  ? Dysphagia - coughing with bedside swallow. - SLP eval.  Best practice:  Diet: NPO.  SLP eval pending. Pain/Anxiety/Delirium protocol (if indicated): N/A. VAP protocol (if indicated): N/A. DVT prophylaxis: Lovenox GI prophylaxis: N/A. Glucose control: SSI if glucose consistently > 180 Mobility: Bed rest Code Status: Full Family Communication: Will call Disposition: ICU  Critical care time: 35 min.    Montey Hora, [facility] 03/08/2020, 9:55 AM

## 2020-03-08 NOTE — Progress Notes (Signed)
Pt beginning to try to exit bed. He has been redirected several times and continues to try to leave. CCM provider notified, New order for soft waist belt restraint ordered. Will place on pt now.

## 2020-03-08 NOTE — Progress Notes (Signed)
Nutrition Follow-up  DOCUMENTATION CODES:   Not applicable  INTERVENTION:   Ensure Enlive po BID, each supplement provides 350 kcal and 20 grams of protein  Encourage PO intake   NUTRITION DIAGNOSIS:   Inadequate oral intake related to lethargy/confusion as evidenced by meal completion < 50%.  GOAL:   Patient will meet greater than or equal to 90% of their needs  MONITOR:   TF tolerance, Vent status  REASON FOR ASSESSMENT:   Consult, Ventilator Enteral/tube feeding initiation and management  ASSESSMENT:   Pt with PMH of several foreign body ingestions (batteries and razors), mixed personality disorder, and factious disorder who was admitted 4/1 to [facility] for drug overdose (UDS positive for cocaine and TCA). Pt intubated and on 4/4 had 2 grand-mal seizures then transferred to [facility] for continuous EEG.   Spoke with RN. Pt is a transfer from [facility]. Per xray pt with foreign body in abd but no change since Jan 2021.   Pt has been agitated, able to advance diet to nectar thick full liquids.  Per MD concern for serotonin syndrome with fevers, hypotension, and tachycardia.   Medications reviewed and include: folic acid, remeron, thiamine   Labs reviewed: K+ 3.3 (L), magnesium 1.6 (L) IV KCl     Diet Order:   Diet Order            Diet full liquid Room service appropriate? Yes; Fluid consistency: Nectar Thick  Diet effective now              EDUCATION NEEDS:   No education needs have been identified at this time  Skin:  Skin Assessment: Skin Integrity Issues: Skin Integrity Issues:: DTI DTI: coccyx  Last BM:  4/7 x 2  Height:   Ht Readings from Last 1 Encounters:  03/03/20 5\' 10"  (1.778 m)    Weight:   Wt Readings from Last 1 Encounters:  03/08/20 87.8 kg    Ideal Body Weight:  75.4 kg  BMI:  Body mass index is 27.77 kg/m.  Estimated Nutritional Needs:   Kcal:  2200-2400  Protein:  115-130 grams  Fluid:  2 L/day  05/08/20., RD, LDN, CNSC See  [facility] for contact information

## 2020-03-08 NOTE — Progress Notes (Signed)
Restraints removed at 0800. Pt has not attempted to get out of bed or pulled at any lines. Discussed with provider. Restraints D/Cd

## 2020-03-08 NOTE — Progress Notes (Signed)
Sitter to room. Bedside report given.

## 2020-03-08 NOTE — Progress Notes (Signed)
eLink Physician-Brief Progress Note Patient Name: DAKARRI KESSINGER DOB: 08/16/1981 MRN: [id number]   Date of Service  03/08/2020  HPI/Events of Note  Remains hypertensive in spite of Labetalol IV and Hydralazine IV.   eICU Interventions  Will order: 1. Nicardipine IV infusion. Titrate to SBP < 160.     Intervention Category Major Interventions: Hypertension - evaluation and management  Ashlynne Shetterly [place] 03/08/2020, 2:47 AM

## 2020-03-08 NOTE — Progress Notes (Signed)
Psychiatry here to examine patient.

## 2020-03-08 NOTE — Progress Notes (Signed)
Have notifiedpharmacy twice for 1615 dose of suboxone.

## 2020-03-09 DIAGNOSIS — T50902A Poisoning by unspecified drugs, medicaments and biological substances, intentional self-harm, initial encounter: Secondary | ICD-10-CM

## 2020-03-09 LAB — GLUCOSE, CAPILLARY
Glucose-Capillary: 84 mg/dL (ref 70–99)
Glucose-Capillary: 94 mg/dL (ref 70–99)

## 2020-03-09 LAB — CBC
HCT: 36.3 % — ABNORMAL LOW (ref 39.0–52.0)
Hemoglobin: 11.5 g/dL — ABNORMAL LOW (ref 13.0–17.0)
MCH: 28 pg (ref 26.0–34.0)
MCHC: 31.7 g/dL (ref 30.0–36.0)
MCV: 88.5 fL (ref 80.0–100.0)
Platelets: 214 10*3/uL (ref 150–400)
RBC: 4.1 MIL/uL — ABNORMAL LOW (ref 4.22–5.81)
RDW: 14.6 % (ref 11.5–15.5)
WBC: 9.6 10*3/uL (ref 4.0–10.5)
nRBC: 0 % (ref 0.0–0.2)

## 2020-03-09 LAB — BASIC METABOLIC PANEL WITH GFR
Anion gap: 10 (ref 5–15)
BUN: 11 mg/dL (ref 6–20)
CO2: 22 mmol/L (ref 22–32)
Calcium: 7.8 mg/dL — ABNORMAL LOW (ref 8.9–10.3)
Chloride: 104 mmol/L (ref 98–111)
Creatinine, Ser: 0.76 mg/dL (ref 0.61–1.24)
GFR calc Af Amer: >60 mL/min (ref >60)
GFR calc non Af Amer: >60 mL/min (ref >60)
Glucose, Bld: 97 mg/dL (ref 70–99)
Potassium: 3.8 mmol/L (ref 3.5–5.1)
Sodium: 136 mmol/L (ref 135–145)

## 2020-03-09 LAB — MAGNESIUM: Magnesium: 2 mg/dL (ref 1.7–2.4)

## 2020-03-09 LAB — PHOSPHORUS: Phosphorus: 2.3 mg/dL — ABNORMAL LOW (ref 2.5–4.6)

## 2020-03-09 MED ORDER — CLONIDINE HCL 0.1 MG PO TABS
0.1000 mg | ORAL_TABLET | Freq: Two times a day (BID) | ORAL | Status: DC
  Administered 2020-03-09 – 2020-03-11 (×4): 0.1 mg via ORAL
  Filled 2020-03-09 (×4): qty 1

## 2020-03-09 MED ORDER — CLONAZEPAM 0.5 MG PO TABS: 0.5000 mg | ORAL_TABLET | Freq: Every day | ORAL | Status: DC

## 2020-03-09 MED ORDER — CLONAZEPAM 0.5 MG PO TABS
0.5000 mg | ORAL_TABLET | Freq: Two times a day (BID) | ORAL | Status: DC
  Administered 2020-03-11: 09:00:00 0.5 mg via ORAL
  Filled 2020-03-09: qty 1

## 2020-03-09 MED ORDER — CLONAZEPAM 0.5 MG PO TABS
1.0000 mg | ORAL_TABLET | Freq: Two times a day (BID) | ORAL | Status: AC
  Administered 2020-03-10 (×2): 1 mg via ORAL
  Filled 2020-03-09 (×2): qty 2

## 2020-03-09 MED ORDER — CLONIDINE HCL 0.1 MG PO TABS
0.1000 mg | ORAL_TABLET | Freq: Every day | ORAL | Status: DC
  Administered 2020-03-09: 10:00:00 0.1 mg via ORAL
  Filled 2020-03-09: qty 1

## 2020-03-09 MED ORDER — LEVETIRACETAM 500 MG PO TABS
500.0000 mg | ORAL_TABLET | Freq: Two times a day (BID) | ORAL | Status: DC
  Administered 2020-03-09 – 2020-03-11 (×4): 500 mg via ORAL
  Filled 2020-03-09 (×4): qty 1

## 2020-03-09 MED ORDER — CLONAZEPAM 0.5 MG PO TABS
1.0000 mg | ORAL_TABLET | Freq: Three times a day (TID) | ORAL | Status: AC
  Administered 2020-03-09 (×3): 1 mg via ORAL
  Filled 2020-03-09: qty 2
  Filled 2020-03-09 (×2): qty 1

## 2020-03-09 NOTE — Progress Notes (Signed)
Spoke with Brother Onalee Hua on phone, per brother, Patient's belongings are with father and stepmother. Patient made aware.

## 2020-03-09 NOTE — Progress Notes (Signed)
Pt has been intermittent resting throughout the night, waking/arousing to voice easily but quickly falls back asleep. Pt during 04 assessment rounds c/o R side facial, arm and leg numbness and tingling.   Pt moving all extremities, R weaker than L. With eyes closed pt unable to tell me when touching the R. Speech as clear as throughout shift. Orientation remains the same. Pupils remain the same. Pt falls quickly asleep during assessment.  E-Link on call physician notified of pt complaints. Will follow up and monitor pt.
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
## 2020-03-09 NOTE — Progress Notes (Signed)
NAME:  Jared Kramer, MRN:  [id number], DOB:  Jan 13, 1981, LOS: 4 ADMISSION DATE:  03/05/2020, CONSULTATION DATE: 03/05/20 REFERRING MD:  EDP, CHIEF COMPLAINT:  Seizures   Brief History   Jared Kramer is a 39 y.o male with a history of several foreign body ingestions (batteries and razors), mixed personality disorder, and factious disorder who presented to [facility] on 4/1 after a drug overdose (UDS + cocaine and TCA). He was subsequently intubated for airway protection. On 4/4 he experience 2 grand-mal seizures and was transferred to [facility] for prolonged EEG.   Past Medical History  Factious Disorder  Mixed personality disorder  Foreign body ingestion (razors, batteries) Polysubstance use disorder (Benzos, opiates) Hepatitis C HTN  Significant Hospital Events   4/1: Intubated in ED for airway protection 4/2: Admitted to ICU at [facility]  4/4: 2 grand mal seizures and transferred to [facility] 4/5: No more seizures on EEG, versed weaned off 4/6: Self extubated 4/7 > seen by psych and felt to not be of any imminent risk to self or others; therefore, does not meet criteria for psych inpatient admission.  Consults:  Neurology   Procedures:  4/1: Intubation (self extubated 4/6).  Significant Diagnostic Tests:  4/1: Chest x-ray>>Endotracheal tube terminates 4 cm above the carina. Lungs are clear.  No pleural effusion or pneumothorax.The heart is normal in size.Old right posterior rib fracture deformities.Enteric tube courses into the stomach. 4/1: Abdominal 1 view x-ray>>Enteric tube terminates in the proximal gastric body. Linear radiopaque foreign body overlies the right upper abdomen 4/2: CT head without contrast>>No acute intracranial abnormality noted. Minimal air-fluid level within the left maxillary antrum. 4/4: CXR>> New left basilar infiltrative opacity. 4/4: DG Abd 2 view>> Multiple radiopaque foreign bodies present, likely either within small bowel or transverse colon. Suspected portion of  needle in left mid abdomen, likely in jejunum. No bowel obstruction or free air evident. Nasogastric tube tip and side port in stomach. Lung bases clear. 4/4 CT abd/pelvis>> Bilateral lower lobe pneumonia with dense consolidation on the left. The pattern suggests aspiration. Over distended bladder leading to bilateral Hydroureteronephrosis. Multiple intra-abdominal and subcutaneous metallic foreign bodies without significant change from January 2021. No visible bowel perforation or obstruction.  Micro Data:  SARS-CoV-2 PCR 4/1>> negative Influenza PCR 4/1>> negative Respiratory cultures 4/4 >> gram-positive rods  Antimicrobials:  Unasyn 4/4>> current  Interim history/subjective:  No acute events.  Cleared by psych. More awake today.  Unable to tell us what he ingested.  Objective   Blood pressure (!) 114/94, pulse 94, temperature 98.2 F (36.8 C), temperature source Oral, resp. rate 14, weight 87.8 kg, SpO2 96 %.        Intake/Output Summary (Last 24 hours) at 03/09/2020 0939 Last data filed at 03/09/2020 0800 Gross per 24 hour  Intake 3839.46 ml  Output 4700 ml  Net -860.54 ml   Filed Weights   03/07/20 0500 03/08/20 0500  Weight: 88.2 kg 87.8 kg    Examination: General: Adult male, resting in bed watching TV, in NAD. Neuro: Awake, follows basic commands, MAE's. HEENT: [place]/AT. Sclerae anicteric. EOMI. Cardiovascular: RRR, no M/R/G.  Lungs: Respirations even and unlabored.  CTA bilaterally, No W/R/R. Abdomen: BS x 4, soft, NT/ND.  Musculoskeletal: No gross deformities, no edema.  Skin: Numerous tattoos throughout.  Skin intact, warm, no rashes.   Assessment & Plan:   Acute hypoxic respiratory failure due to polysubstance overdose. Intubated for airway protection due to drug overdose - s/p self extubation 4/6. - Continue supplemental O2  as needed to maintain SpO2 > 92%.  Sinus tachycardia - Likely secondary to fevers/PNA vs withdrawal. - Monitor.  Hypertension. -  Continue cardene for now but hoping to d/c today. - Start clonidine low dose (would also be synergistic for withdrawal).  Lower lobe airspace disease seen on CT, aspiration pneumonia - Continue unasyn as PCT is high and he is febrile.  Acute metabolic encephalopathy secondary to drug overdose - Unclear if intentional vs. Unintentional.  UDS positive for cocaine and tricyclics.  EtOH, APAP, salicylates neg.  Cleared by psychiatry 4/7. - Monitor QTc closely in setting of TCA overdose.  - Empiric thiamine / folate. - No further needs per psych. - Continue suboxone.  ? Serotonin syndrome - after TCA overdose.  S/p 2 doses of cyproheptadine. - Supportive care.   Seizure like activity. - Keppra and lorazepam per neuro. - Hold klonopin due to altered MS and NPO status.   Foreign body ingestion - CT abdomen and pelvis noncontrasted showed multiple foreign objects.  General surgery consulted at [facility] reviewed outside records and felt that these foreign bodies were stable from prior episodes. Recommended against surgical intervention at this point.  - Psych consult as above.   Urinary retention - CT Abd/Pelvis with severally distended bladder. Possible secondary to medications.   Improved, good UOP and now just +1L. - D/c foley.  Dysphagia. - Nectar thick liquid per SLP.  Best practice:  Diet:  Nectar thick liquid. Pain/Anxiety/Delirium protocol (if indicated): N/A. VAP protocol (if indicated): N/A. DVT prophylaxis: Lovenox GI prophylaxis: N/A. Glucose control: SSI if glucose consistently > 180 Mobility: Bed rest Code Status: Full Family Communication: Will call Disposition: ICU   Rutherford Guys, PA [facility] 03/09/2020, 9:39 AM

## 2020-03-09 NOTE — Progress Notes (Signed)
Pt woke suddenly, attempting to get out of bed. Able to move all extremities equally. When asked to move his R upper and lower extremities pt was able to and stated "i'm good now, I walked around for a bit". Pt was reassured he did not walk around.  Pt still quite drowsy and falls asleep when in the room with him.  Will page Neuro on call physician to notify of these events.

## 2020-03-09 NOTE — Progress Notes (Signed)
  Speech Language Pathology Treatment: Dysphagia  Patient Details Name: Jared Kramer MRN: [id number] DOB: 1981-03-22 Today's Date: 03/09/2020 Time: 5176-1607 SLP Time Calculation (min) (ACUTE ONLY): 14 min  Assessment / Plan / Recommendation Clinical Impression  Pt remains drowsy today but more able to participate in self-feedig, and he shows improvements overall with his swallowing function given additional time since extubation. He does not have as many subswallows per bolus and he can drink three ounces of water consecutively without coughing. He did have difficulty maintaining alertness throughout mastication of dry solids, needing Mod cues for clearance and multiple liquid washes followed by a delayed cough. Given mentation, recommend keeping food relatively soft for now and would continue full supervision. Will advance diet to Dys 2 (chopped) solids with thin liquids. Potential for further advancement is good pending clearing mentation.   HPI HPI: Pt is a 39yo male who presented to [facility] 4/1 after drug overdose (UDS + cocaine and TCA). He was intubated in the ED 4/1; self0extubated 4/6. On 4/4 he had 2 grand mal seizures and was subsequently transferred to [facility]. PMH includes: several foreign body ingestions, mixed personality disorder, factitious disorder, Hep C, HTN, polysubstance abuse      SLP Plan  Continue with current plan of care       Recommendations  Diet recommendations: Dysphagia 2 (fine chop);Thin liquid Liquids provided via: Straw;Cup Medication Administration: Whole meds with puree Supervision: Patient able to self feed;Full supervision/cueing for compensatory strategies Compensations: Minimize environmental distractions;Slow rate;Small sips/bites Postural Changes and/or Swallow Maneuvers: Seated upright 90 degrees                Oral Care Recommendations: Oral care BID Follow up Recommendations: (tba - none anticipated) SLP Visit Diagnosis: Dysphagia,  unspecified (R13.10) Plan: Continue with current plan of care       GO                 Mahala Menghini., M.A. CCC-SLP Acute Rehabilitation Services Pager [phone] Office [phone]  03/09/2020, 9:35 AM

## 2020-03-10 DIAGNOSIS — T50901D Poisoning by unspecified drugs, medicaments and biological substances, accidental (unintentional), subsequent encounter: Secondary | ICD-10-CM

## 2020-03-10 DIAGNOSIS — J9601 Acute respiratory failure with hypoxia: Secondary | ICD-10-CM

## 2020-03-10 LAB — CBC
HCT: 35.9 % — ABNORMAL LOW (ref 39.0–52.0)
Hemoglobin: 11.1 g/dL — ABNORMAL LOW (ref 13.0–17.0)
MCH: 27.4 pg (ref 26.0–34.0)
MCHC: 30.9 g/dL (ref 30.0–36.0)
MCV: 88.6 fL (ref 80.0–100.0)
Platelets: 241 10*3/uL (ref 150–400)
RBC: 4.05 MIL/uL — ABNORMAL LOW (ref 4.22–5.81)
RDW: 14.6 % (ref 11.5–15.5)
WBC: 8.1 10*3/uL (ref 4.0–10.5)
nRBC: 0 % (ref 0.0–0.2)

## 2020-03-10 LAB — GLUCOSE, CAPILLARY
Glucose-Capillary: 73 mg/dL (ref 70–99)
Glucose-Capillary: 102 mg/dL — ABNORMAL HIGH (ref 70–99)
Glucose-Capillary: 161 mg/dL — ABNORMAL HIGH (ref 70–99)

## 2020-03-10 LAB — BASIC METABOLIC PANEL
Anion gap: 10 (ref 5–15)
BUN: 14 mg/dL (ref 6–20)
CO2: 25 mmol/L (ref 22–32)
Calcium: 8.2 mg/dL — ABNORMAL LOW (ref 8.9–10.3)
Chloride: 102 mmol/L (ref 98–111)
Creatinine, Ser: 0.93 mg/dL (ref 0.61–1.24)
GFR calc Af Amer: >60 mL/min (ref >60)
GFR calc non Af Amer: >60 mL/min (ref >60)
Glucose, Bld: 110 mg/dL — ABNORMAL HIGH (ref 70–99)
Potassium: 3.8 mmol/L (ref 3.5–5.1)
Sodium: 137 mmol/L (ref 135–145)

## 2020-03-10 LAB — PHOSPHORUS: Phosphorus: 4.1 mg/dL (ref 2.5–4.6)

## 2020-03-10 MED ORDER — BUPROPION HCL ER (XL) 150 MG PO TB24
300.0000 mg | ORAL_TABLET | Freq: Every day | ORAL | Status: DC
  Administered 2020-03-10 – 2020-03-11 (×2): 300 mg via ORAL
  Filled 2020-03-10 (×2): qty 2

## 2020-03-10 MED ORDER — GABAPENTIN 600 MG PO TABS
300.0000 mg | ORAL_TABLET | Freq: Three times a day (TID) | ORAL | Status: DC
  Administered 2020-03-10 – 2020-03-11 (×3): 300 mg via ORAL
  Filled 2020-03-10 (×3): qty 1

## 2020-03-10 MED ORDER — NICOTINE 21 MG/24HR TD PT24
21.0000 mg | MEDICATED_PATCH | Freq: Every day | TRANSDERMAL | Status: DC
  Administered 2020-03-10 – 2020-03-11 (×2): 21 mg via TRANSDERMAL
  Filled 2020-03-10 (×2): qty 1

## 2020-03-10 NOTE — Progress Notes (Signed)
PROGRESS NOTE   Jared Kramer  HUD:[id number]    DOB: 1981-06-30    DOA: 03/05/2020  PCP: Patient, No Pcp Per   I have briefly reviewed patients previous medical records in [facility].  Chief Complaint:     Brief Narrative:  39 year old male with PMH of several foreign body ingestions (batteries and razors), mixed personality disorder, factious disorder, polysubstance use disorder (benzos and opiates), hepatitis C and HTN, presented to [facility] on 4/1 after a drug overdose (UDS positive for cocaine and TCA).  He was subsequently intubated for airway protection.  On 4/4 he experienced 2 grand mal seizures and was transferred to [facility] for prolonged EEG.  He was admitted to ICU under CCM service.  Neurology consulted.  Stabilized and transferred to [facility] on 4/9.  Significant Hospital events:  4/1:Intubated in ED for airway protection 4/2:Admitted to ICU at [facility]  4/4: 2 grand mal seizures and transferred to [facility] 4/5: No more seizures on EEG, versed weaned off 4/6: Self extubated 4/7 > seen by psych and felt to not be of any imminent risk to self or others; therefore, does not meet criteria for psych inpatient admission.   Assessment & Plan:  Active Problems:   Drug overdose   Elevated blood pressure affecting pregnancy in first trimester, antepartum   Acute encephalopathy   Swallowed foreign body   Acute hypoxic respiratory failure due to polysubstance overdose: Intubated for airway protection due to drug overdose on 4/1.  Self extubated 4/6.  Resolved.  Sinus tachycardia: Likely related to pneumonia/fevers versus withdrawal.  Resolved.  Hypertension: Treated with Cardene drip which has now been weaned off.  Currently on clonidine with reasonable control.  Aspiration pneumonia, left lower lobe: Complete course of IV Unasyn, day 6 of 7.  Afebrile.  Still has productive cough.  Acute metabolic encephalopathy secondary to drug overdose: Unclear if this was intentional versus  unintentional.  UDS positive for cocaine and tricyclics.  EtOH, APAP, salicylates negative.  Cleared by psychiatry on 4/7.  Empiric thiamine and folate.  Continue Suboxone.  QTc 426 by EKG 4/4.  At review, patient reported to PCCM that he took medications from his friend's prescriptions and not his own, continues to deny suicidal ideations or homicidal ideations.  He does have an extensive prior psychiatric history including ingestion of metallic objects.  ?  Serotonin syndrome: After TCA overdose.  S/p 2 doses of cyproheptadine.  Seems to have resolved.  Seizure-like activity: Keppra 500 twice daily and clonazepam taper per neurology.  Consider restarting carbamazepine and Neurontin home medications, reportedly okay with neurology.  Neurology follow-up 4/8 appreciated, recommended outpatient follow-up with psychiatry as well as neurology in 8 to 12 weeks.  Keppra can most likely be tapered off or discontinued if patient remains seizure-free during outpatient visit.  Foreign body ingestion: CT abdomen and pelvis noncontrasted showed multiple foreign objects.  General surgery consulted at [facility] reviewed outside records and felt that these foreign bodies were stable from prior episodes.  Recommended against surgical intervention at this point.  Urinary retention: CT abdomen and pelvis with severely distended bladder.  Possible secondary to medications.  Improved.  Foley catheter discontinued.  Renal ultrasound to evaluate hydroureteronephrosis.  Dysphagia: Continue dysphagia 2 diet and thin liquids.  Polysubstance abuse: Suboxone noted on his home medications, restarted due to concern for opioid withdrawal.  He states that this was prescribed by [facility] and will need to follow-up with them regarding refills.  Anemia: Stable.  Psychiatric: As per home  medications reviewed by pharmacy on 4/6, patient was taking Wellbutrin XL and gabapentin but not Tegretol.  I discussed in detail with Dr. Melynda Ripple,  neurology who recommends resuming prior home dose of Wellbutrin XL but resume gabapentin at low-dose 300 mg twice daily.  Since he was not taking Tegretol, no need to restart.  She tells me that while he was in the ICU, ICU were able to call [facility] who reported that they have not been feeling his Suboxone prescriptions.  Will need to either taper this or find a follow-up place for him at discharge.  Body mass index is 27.77 kg/m.  Nutritional Status Nutrition Problem: Inadequate oral intake Etiology: lethargy/confusion Signs/Symptoms: meal completion < 50% Interventions: Ensure Enlive (each supplement provides 350kcal and 20 grams of protein)  DVT prophylaxis: Lovenox Code Status: Full Family Communication: None at bedside Disposition:  . Patient came from: Home           . Anticipated d/c place: Home . Barriers to d/c: Aspiration pneumonia on IV antibiotics.   Consultants:   PCCM-signed off 4/8 Neurology  Procedures:   4/1 intubated, self extubated on 4/6.  Antimicrobials:   IV Unasyn   Subjective:  Cough with ongoing productive green sputum.  Chest pain with coughing.  No dyspnea.  No suicidal or homicidal ideations.  Reportedly hit right lower chest to the rails while getting up with mild soreness earlier which has improved.  No bruising or bleeding  Objective:   Vitals:   03/10/20 0809 03/10/20 1218 03/10/20 1219 03/10/20 1611  BP:   133/86 (!) 162/95  Pulse:   88 92  Resp:   20 16  Temp:   99.3 F (37.4 C) 99 F (37.2 C)  TempSrc:   Oral Oral  SpO2: 99% 97% 97% 99%  Weight:        General exam: Pleasant young male, well built and nourished lying comfortably supine in bed.  Safety sitter at bedside. Respiratory system: Scattered few basal crackles but otherwise clear to auscultation. Respiratory effort normal. Cardiovascular system: S1 & S2 heard, RRR. No JVD, murmurs, rubs, gallops or clicks. No pedal edema.  Telemetry personally reviewed: Sinus  rhythm. Gastrointestinal system: Abdomen is nondistended, soft and nontender. No organomegaly or masses felt. Normal bowel sounds heard. Central nervous system: Alert and oriented. No focal neurological deficits. Extremities: Symmetric 5 x 5 power. Skin: No rashes, lesions or ulcers.  Extensive tattoos all over his body including face. Psychiatry: Judgement and insight appear normal. Mood & affect appropriate.     Data Reviewed:   I have personally reviewed following labs and imaging studies   CBC: Recent Labs  Lab 03/08/20 0526 03/09/20 0503 03/10/20 0404  WBC 12.3* 9.6 8.1  HGB 12.0* 11.5* 11.1*  HCT 37.4* 36.3* 35.9*  MCV 88.2 88.5 88.6  PLT 209 214 241    Basic Metabolic Panel: Recent Labs  Lab 03/07/20 0509 03/07/20 1756 03/08/20 0526 03/09/20 0503 03/10/20 0404  NA   < >  --  138 136 137  K   < >  --  3.3* 3.8 3.8  CL   < >  --  106 104 102  CO2   < >  --  19* 22 25  GLUCOSE   < >  --  109* 97 110*  BUN   < >  --  12 11 14   CREATININE   < >  --  0.84 0.76 0.93  CALCIUM   < >  --  8.4* 7.8* 8.2*  MG  --  1.7 1.6* 2.0  --   PHOS   < > 4.4 3.1 2.3* 4.1   < > = values in this interval not displayed.    Liver Function Tests: No results for input(s): AST, ALT, ALKPHOS, BILITOT, PROT, ALBUMIN in the last 168 hours.  CBG: Recent Labs  Lab 03/09/20 1235 03/09/20 1618 03/10/20 1243  GLUCAP 84 94 73    Microbiology Studies:   Recent Results (from the past 240 hour(s))  SARS CORONAVIRUS 2 (TAT 6-24 HRS) Nasopharyngeal Nasopharyngeal Swab     Status: None   Collection Time: 03/02/20 10:55 PM   Specimen: Nasopharyngeal Swab  Result Value Ref Range Status   SARS Coronavirus 2 NEGATIVE NEGATIVE Final    Comment: (NOTE) SARS-CoV-2 target nucleic acids are NOT DETECTED. The SARS-CoV-2 RNA is generally detectable in upper and lower respiratory specimens during the acute phase of infection. Negative results do not preclude SARS-CoV-2 infection, do not rule  out co-infections with other pathogens, and should not be used as the sole basis for treatment or other patient management decisions. Negative results must be combined with clinical observations, patient history, and epidemiological information. The expected result is Negative. Fact Sheet for Patients: SugarRoll.be Fact Sheet for Healthcare Providers: https://www.woods-mathews.com/ This test is not yet approved or cleared by the [place] FDA and  has been authorized for detection and/or diagnosis of SARS-CoV-2 by FDA under an Emergency Use Authorization (EUA). This EUA will remain  in effect (meaning this test can be used) for the duration of the COVID-19 declaration under Section 56 4(b)(1) of the Act, 21 U.S.C. section 360bbb-3(b)(1), unless the authorization is terminated or revoked sooner. Performed at [facility], [place] [address], [place], [place] [address]   Respiratory Panel by RT PCR (Flu A&B, Covid) - Nasopharyngeal Swab     Status: None   Collection Time: 03/03/20  3:15 AM   Specimen: Nasopharyngeal Swab  Result Value Ref Range Status   SARS Coronavirus 2 by RT PCR NEGATIVE NEGATIVE Final    Comment: (NOTE) SARS-CoV-2 target nucleic acids are NOT DETECTED. The SARS-CoV-2 RNA is generally detectable in upper respiratoy specimens during the acute phase of infection. The lowest concentration of SARS-CoV-2 viral copies this assay can detect is 131 copies/mL. A negative result does not preclude SARS-Cov-2 infection and should not be used as the sole basis for treatment or other patient management decisions. A negative result may occur with  improper specimen collection/handling, submission of specimen other than nasopharyngeal swab, presence of viral mutation(s) within the areas targeted by this assay, and inadequate number of viral copies (<131 copies/mL). A negative result must be combined with clinical observations,  patient history, and epidemiological information. The expected result is Negative. Fact Sheet for Patients:  PinkCheek.be Fact Sheet for Healthcare Providers:  GravelBags.it This test is not yet ap proved or cleared by the [place] FDA and  has been authorized for detection and/or diagnosis of SARS-CoV-2 by FDA under an Emergency Use Authorization (EUA). This EUA will remain  in effect (meaning this test can be used) for the duration of the COVID-19 declaration under Section 564(b)(1) of the Act, 21 U.S.C. section 360bbb-3(b)(1), unless the authorization is terminated or revoked sooner.    Influenza A by PCR NEGATIVE NEGATIVE Final   Influenza B by PCR NEGATIVE NEGATIVE Final    Comment: (NOTE) The Xpert Xpress SARS-CoV-2/FLU/RSV assay is intended as an aid in  the diagnosis of influenza from  Nasopharyngeal swab specimens and  should not be used as a sole basis for treatment. Nasal washings and  aspirates are unacceptable for Xpert Xpress SARS-CoV-2/FLU/RSV  testing. Fact Sheet for Patients: https://www.moore.com/ Fact Sheet for Healthcare Providers: https://www.young.biz/ This test is not yet approved or cleared by the [place] FDA and  has been authorized for detection and/or diagnosis of SARS-CoV-2 by  FDA under an Emergency Use Authorization (EUA). This EUA will remain  in effect (meaning this test can be used) for the duration of the  Covid-19 declaration under Section 564(b)(1) of the Act, 21  U.S.C. section 360bbb-3(b)(1), unless the authorization is  terminated or revoked. Performed at [facility], [address] [place], [place] [address]   MRSA PCR Screening     Status: None   Collection Time: 03/03/20  5:08 AM   Specimen: Nasopharyngeal  Result Value Ref Range Status   MRSA by PCR NEGATIVE NEGATIVE Final    Comment:        The GeneXpert MRSA Assay  (FDA approved for NASAL specimens only), is one component of a comprehensive MRSA colonization surveillance program. It is not intended to diagnose MRSA infection nor to guide or monitor treatment for MRSA infections. Performed at [facility], [address] [place], [place] [address]   Culture, respiratory (non-expectorated)     Status: None   Collection Time: 03/05/20  3:25 AM   Specimen: Tracheal Aspirate; Respiratory  Result Value Ref Range Status   Specimen Description   Final    TRACHEAL ASPIRATE Performed at [facility], [address] [place], [place] [address]    Special Requests   Final    NONE Performed at [facility], [address] [place], [place] [address]    Gram Stain   Final    ABUNDANT WBC PRESENT, PREDOMINANTLY PMN ABUNDANT GRAM POSITIVE RODS    Culture   Final    ABUNDANT DIPHTHEROIDS(CORYNEBACTERIUM SPECIES) Standardized susceptibility testing for this organism is not available. Performed at [facility], [address], [place], [place] [address]    Report Status 03/07/2020 FINAL  Final     Radiology Studies:  No results found.   Scheduled Meds:   . buprenorphine-naloxone  1 tablet Sublingual BID  . chlorhexidine  15 mL Mouth Rinse BID  . Chlorhexidine Gluconate Cloth  6 each Topical Daily  . clonazePAM  1 mg Oral BID   Followed by  . [START ON 03/11/2020] clonazePAM  0.5 mg Oral BID   Followed by  . [START ON 03/12/2020] clonazePAM  0.5 mg Oral Daily  . cloNIDine  0.1 mg Oral BID  . enoxaparin (LOVENOX) injection  40 mg Subcutaneous Q24H  . feeding supplement (ENSURE ENLIVE)  237 mL Oral BID BM  . folic acid  1 mg Intravenous Daily   Or  . folic acid  1 mg Oral Daily  . levETIRAcetam  500 mg Oral BID  . mouth rinse  15 mL Mouth Rinse q12n4p  . mirtazapine  15 mg Oral QHS  . thiamine injection  100 mg Intravenous Daily   Or  . thiamine  100 mg Oral Daily    Continuous Infusions:   .  ampicillin-sulbactam (UNASYN) IV 3 g (03/10/20 1527)     LOS: 5 days     Marcellus Scott, MD, [place], [facility]. [facility]    To contact the attending provider between 7A-7P or the covering provider during after hours 7P-7A, please log into the web site  [email or web address] and access using universal [place] password for that web site. If you do not have the password, please call the hospital operator.  03/10/2020, 4:16 PM

## 2020-03-10 NOTE — Progress Notes (Signed)
SLP Cancellation Note  Patient Details Name: Jared Kramer MRN: [id number] DOB: 17-Nov-1981   Cancelled treatment:       Reason Eval/Treat Not Completed: Patient declined, no reason specified (Pt was approached for dysphagia treatment to assess diet tolerance but reported that he is "trying to talk to my family right now" and declined treatment. RN reported that the pt has been tolerating the current diet. SLP will follow up on subsequent date.)  Emalia Witkop I. Vear Clock, MS, CCC-SLP Acute Rehabilitation Services Office number [phone] Pager [phone]  Scheryl Marten 03/10/2020, 3:18 PM

## 2020-03-10 NOTE — Progress Notes (Signed)
SLP Cancellation Note  Patient Details Name: BILLY TURVEY MRN: [id number] DOB: 04/15/1981   Cancelled treatment:       Reason Eval/Treat Not Completed: Patient at procedure or test/unavailable(Pt in bathroom at this time. SLP will follow up)  Keshan Reha I. Vear Clock, MS, CCC-SLP Acute Rehabilitation Services Office number [phone] Pager [phone]  Scheryl Marten 03/10/2020, 3:10 PM

## 2020-03-10 NOTE — Care Plan (Signed)
Discussed patient's home medications with Dr. Waymon Amato.   - From neurology standpoint, it is okay to resume patient's home dose of Wellbutrin.   - Per pharmacy med rec, patient was on 1200 mg 3 times daily gabapentin which is a relatively high dose.  Therefore I would recommend starting gabapentin 300 mg 3 times daily and gradually increase if needed to maximum of home dose. -It appears that patient was not taking his carbamazepine, therefore okay to hold off restarting while inpatient -Patient was started on Suboxone while inpatient due to concern for opioid withdrawal.  One of our 4N ICU nurses had spoken with [facility] and was told that patient was not being prescribed Suboxone from their clinic.  Therefore, would be helpful for patient to see psychiatry can assist with Suboxone prescription as an outpatient if needed.  Thank you for allowing [place] to participate in the care of this patient.  Please page neurology for any further questions.  Jurnie Garritano Annabelle Harman

## 2020-03-11 LAB — GLUCOSE, CAPILLARY
Glucose-Capillary: 68 mg/dL — ABNORMAL LOW (ref 70–99)
Glucose-Capillary: 98 mg/dL (ref 70–99)
Glucose-Capillary: 90 mg/dL (ref 70–99)
Glucose-Capillary: 84 mg/dL (ref 70–99)

## 2020-03-11 MED ORDER — SODIUM CHLORIDE 0.9% FLUSH: 10.0000 mL | INTRAVENOUS | Status: DC | PRN

## 2020-03-11 MED ORDER — SODIUM CHLORIDE 0.9% FLUSH
10.0000 mL | Freq: Two times a day (BID) | INTRAVENOUS | Status: DC
  Administered 2020-03-11: 10:00:00 10 mL

## 2020-03-11 NOTE — Progress Notes (Signed)
Pt left AMA at 1206, AMA form signed at 1200.  Provider notified (Dr. Waymon Amato, MD) at 1207.  Pt allowed removal of midline IV and removal of telemetry prior to leaving AMA.

## 2020-03-11 NOTE — Discharge Summary (Signed)
Physician Discharge Summary  Jared FARREL Kramer:[id number] DOB: 1981/05/13  PCP: Patient, No Pcp Per   Patient left the hospital AGAINST MEDICAL ADVICE.    Admitted from: Home  Discharged to: Home  Admit date: 03/05/2020 Discharge date: 03/11/2020  Recommendations for Outpatient Follow-up:  Unfortunately patient was not yet medically stable for discharge but he decided to leave AMA despite being warned clearly of the risks of leaving including deterioration and death.  He has capacity to make medical decisions but obviously is making poor judgment.  He was advised to seek immediate medical attention for any worsening of his condition.  He did not even wait for MD to fill some of his important medications i.e. Keppra.  Also patient was strictly advised that he should not drive after leaving from the hospital or for the next 6 months.  All of this was discussed with patient's RN.    Home Health: N/A Equipment/Devices: N/A    Discharge Condition: Guarded CODE STATUS: Full  Diet recommendation: None made  Discharge Diagnoses:  Active Problems:   Drug overdose   Elevated blood pressure affecting pregnancy in first trimester, antepartum   Acute encephalopathy   Swallowed foreign body   Brief Summary: 39 year old male with PMH of several foreign body ingestions (batteries and razors), mixed personality disorder, factious disorder, polysubstance use disorder (benzos and opiates), hepatitis C and HTN, presented to [facility] on 4/1 after a drug overdose (UDS positive for cocaine and TCA).  He was subsequently intubated for airway protection.  On 4/4 he experienced 2 grand mal seizures and was transferred to [facility] for prolonged EEG.  He was admitted to ICU under CCM service.  Neurology consulted.  Stabilized and transferred to [facility] on 4/9.  Significant Hospital events:  4/1:Intubated in ED for airway protection 4/2:Admitted to ICU at [facility]  4/4: 2 grand mal seizures and transferred to [facility] 4/5:  No more seizures on EEG, versed weaned off 4/6: Self extubated 4/7 > seen by psych and felt to not be of any imminent risk to self or others; therefore, does not meet criteria for psych inpatient admission.   Assessment & Plan:    Acute hypoxic respiratory failure due to polysubstance overdose: Intubated for airway protection due to drug overdose on 4/1.  Self extubated 4/6.  Resolved.  Sinus tachycardia: Likely related to pneumonia/fevers versus withdrawal.    Mild ST in the 100s noted this morning on telemetry.  Patient asymptomatic.  He seemed to be worked up after a phone call with someone either family or friends sent stated that he was dealing with some issues.  Hypertension: Treated with Cardene drip which has now been weaned off.  Currently on clonidine with reasonable control.  Patient did not wait for MD to complete his discharge paperwork or prescriptions.  Aspiration pneumonia, left lower lobe: Complete course of IV Unasyn, day 6 of 7.  Afebrile.  Still has productive cough.  Acute metabolic encephalopathy secondary to drug overdose: Unclear if this was intentional versus unintentional.  UDS positive for cocaine and tricyclics.  EtOH, APAP, salicylates negative.  Cleared by psychiatry on 4/7.  Empiric thiamine and folate.  Continue Suboxone.  QTc 426 by EKG 4/4.  At review, patient reported to PCCM that he took medications from his friend's prescriptions and not his own, continues to deny suicidal ideations or homicidal ideations.  He does have an extensive prior psychiatric history including ingestion of metallic objects.  Resolved.  ?  Serotonin syndrome: After TCA overdose.  S/p  2 doses of cyproheptadine.  Seems to have resolved.  Seizure-like activity: Keppra 500 twice daily and clonazepam taper per neurology. Neurology follow-up 4/8 appreciated, recommended outpatient follow-up with psychiatry as well as neurology in 8 to 12 weeks.  Keppra can most likely be tapered off  or discontinued if patient remains seizure-free during outpatient visit.  Again patient did not wait for Keppra prescriptions or to recommend outpatient neurology follow-up.  Foreign body ingestion: CT abdomen and pelvis noncontrasted showed multiple foreign objects.  General surgery consulted at [facility] reviewed outside records and felt that these foreign bodies were stable from prior episodes.  Recommended against surgical intervention at this point.  Urinary retention: CT abdomen and pelvis with severely distended bladder.  Possible secondary to medications.  Improved.  Foley catheter discontinued.  Renal ultrasound to evaluate hydroureteronephrosis, patient did not wait further for evaluation.  Dysphagia: Continue dysphagia 2 diet and thin liquids.  Polysubstance abuse: Suboxone noted on his home medications, restarted due to concern for opioid withdrawal.  He states that this was prescribed by [facility] and will need to follow-up with them regarding refills.  However upon discussion with psychiatry today, it appears that patient had not filled this based on PDMP review since 12/22/2019 but was still taking oxycodone.  It appears to be some drug-seeking behavior  Anemia: Stable.  Psychiatric: As per home medications reviewed by pharmacy on 4/6, patient was taking Wellbutrin XL and gabapentin but not Tegretol.  I discussed in detail with Dr. Melynda Ripple, neurology on 4/9 who recommended resuming prior home dose of Wellbutrin XL but resume gabapentin at low-dose 300 mg 3 times daily.  Since he was not taking Tegretol, no need to restart.  She tells me that while he was in the ICU, ICU were able to call [facility] who reported that they have not been feeling his Suboxone prescriptions.  Will need to either taper this or find a follow-up place for him at discharge.  I had discontinued Remeron last night.  Today I discussed with the psychiatrist on call regarding appropriate medications for his  anxiety/depression.  He advised me to stop the Wellbutrin due to it lowering the seizure threshold and worsening anxiety and advised to start Remeron at bedtime.  However again patient left prior to implementing these changes or providing him with prescriptions.  Body mass index is 27.77 kg/m.  Nutritional Status Nutrition Problem: Inadequate oral intake Etiology: lethargy/confusion Signs/Symptoms: meal completion < 50% Interventions: Ensure Enlive (each supplement provides 350kcal and 20 grams of protein)   Consultants:   PCCM-signed off 4/8 Neurology  Procedures:   4/1 intubated, self extubated on 4/6.      Discharge Instructions Unable to provide any because patient did not wait for paperwork to be completed and left in a hurry.  Allergies  Allergen Reactions  . Ketamine Other (See Comments)    Laryngospasm October 2020 Ketamine /kg IM given  . Vancomycin Shortness Of Breath, Palpitations and Other (See Comments)    Stops breathing!!!!   . Fish-Derived Products Rash  . Carbamazepine Other (See Comments)    Reaction not noted  . Haloperidol Lactate Other (See Comments)    Dystonia (per pt)  . Famotidine Swelling, Rash and Other (See Comments)    IV- Arm became swollen   . Fentanyl Nausea And Vomiting  . Ibuprofen Other (See Comments)    Bleeding; tolerates ketorolac   . Latex Rash and Other (See Comments)    "It breaks me out badly"  .  Povidone-Iodine Rash  . Shellfish-Derived Products Rash      Procedures/Studies: CT ABDOMEN PELVIS WO CONTRAST  Result Date: 03/05/2020 CLINICAL DATA:  Ingestion of foreign body EXAM: CT ABDOMEN AND PELVIS WITHOUT CONTRAST TECHNIQUE: Multidetector CT imaging of the abdomen and pelvis was performed following the standard protocol without IV contrast. COMPARISON:  12/29/2019 FINDINGS: Lower chest: Airspace disease in both lower lobes with dense consolidation on the left where there is some volume loss. Hepatobiliary: No  focal liver abnormality.No evidence of biliary obstruction or stone. Pancreas: Unremarkable. Spleen: Unremarkable. Adrenals/Urinary Tract: Negative adrenals. Mild bilateral hydroureteronephrosis is due to an over distended urinary bladder which reaches the umbilicus. Stomach/Bowel: Linear metallic foreign body associated with the ventral stomach wall at the level of the body. There is an extraluminal linear metallic body at the level of the pylorus. Both of these are unchanged not significantly changed from before. Metallic densities associated with the ileocolic mesentery and ileocecal region. There has likely been enterocolonic anastomosis in the right lower quadrant. No new foreign body is seen. An enteric tube tip reaches the proximal stomach. Vascular/Lymphatic: No acute vascular abnormality. No mass or adenopathy. Reproductive:Negative Other: No ascites or pneumoperitoneum. There are multiple metallic foreign bodies in the subcutaneous ventral abdominal wall that are not significantly changed. Musculoskeletal: No acute abnormalities. These results will be called to the ordering clinician or representative by the Radiologist Assistant, and communication documented in the PACS or [employer]. IMPRESSION: 1. Bilateral lower lobe pneumonia with dense consolidation on the left. The pattern suggests aspiration. 2. Over distended bladder leading to bilateral hydroureteronephrosis. 3. Multiple intra-abdominal and subcutaneous metallic foreign bodies without significant change from January 2021. No visible bowel perforation or obstruction. Electronically Signed   By: Marnee Spring M.D.   On: 03/05/2020 10:05   DG Chest 1 View  Result Date: 03/02/2020 CLINICAL DATA:  Intubation EXAM: CHEST  1 VIEW COMPARISON:  12/20/2019 FINDINGS: Endotracheal tube terminates 4 cm above the carina. Lungs are clear.  No pleural effusion or pneumothorax. The heart is normal in size. Old right posterior rib fracture deformities.  Enteric tube courses into the stomach. IMPRESSION: Endotracheal tube terminates 4 cm above the carina. No evidence of acute cardiopulmonary disease. Electronically Signed   By: Charline Bills M.D.   On: 03/02/2020 23:51   DG Abd 1 View  Result Date: 03/05/2020 CLINICAL DATA:  NG tube placement EXAM: ABDOMEN - 1 VIEW COMPARISON:  None. FINDINGS: The enteric tube tip projects over the gastric body. The side hole projects near the GE junction. Again noted is a linear radiopaque foreign body projecting over the right upper quadrant. The bowel gas pattern is nonspecific and nonobstructive. Old healed right-sided rib fractures are noted. IMPRESSION: Enteric tube tip projects over the gastric body. The side hole projects near the GE junction. Again noted is a linear foreign body projecting over the right upper quadrant. Electronically Signed   By: Katherine Mantle M.D.   On: 03/05/2020 02:15   DG Abdomen 1 View  Result Date: 03/02/2020 CLINICAL DATA:  ET/OG tube placement EXAM: ABDOMEN - 1 VIEW COMPARISON:  12/29/2019 FINDINGS: Enteric tube terminates in the proximal gastric body. Linear radiopaque foreign body overlies the right upper abdomen. IMPRESSION: Enteric tube terminates in the proximal gastric body. Linear radiopaque foreign body overlies the right upper abdomen. Electronically Signed   By: Charline Bills M.D.   On: 03/02/2020 23:50   CT Head Wo Contrast  Result Date: 03/03/2020 CLINICAL DATA:  Possible overdose EXAM: CT  HEAD WITHOUT CONTRAST TECHNIQUE: Contiguous axial images were obtained from the base of the skull through the vertex without intravenous contrast. COMPARISON:  08/06/2019 FINDINGS: Brain: No evidence of acute infarction, hemorrhage, hydrocephalus, extra-axial collection or mass lesion/mass effect. Vascular: No hyperdense vessel or unexpected calcification. Skull: Normal. Negative for fracture or focal lesion. Sinuses/Orbits: Minimal air-fluid level is noted within the left  maxillary antrum. Other: None IMPRESSION: No acute intracranial abnormality noted. Minimal air-fluid level within the left maxillary antrum. Electronically Signed   By: Alcide Clever M.D.   On: 03/03/2020 00:45   DG Chest Port 1 View  Result Date: 03/08/2020 CLINICAL DATA:  Acute respiratory failure. EXAM: PORTABLE CHEST 1 VIEW COMPARISON:  03/07/2020 FINDINGS: Post extubation. Left IJ central venous catheter remains in place, tip at the proximal to mid superior vena cava. Cardiomediastinal contours are stable with mild cardiac enlargement accentuated by portable technique. Lung volumes slightly diminished compared to the previous radiograph. No dense consolidation. Increased opacity in the retrocardiac region may be slightly improved. Graded opacity in the left lower chest also improved. Visualized skeletal structures with signs of previous right-sided rib fractures as before. IMPRESSION: 1. Interval extubation. 2. Slightly diminished lung volumes but with potential decrease in left lower lobe airspace disease and effusion. 3. Stable mild cardiac enlargement. Electronically Signed   By: Donzetta Kohut M.D.   On: 03/08/2020 08:44   DG Chest Port 1 View  Result Date: 03/07/2020 CLINICAL DATA:  Acute respiratory failure. EXAM: PORTABLE CHEST 1 VIEW COMPARISON:  03/05/2020 FINDINGS: The endotracheal tube is 5 cm above the carina. The NG tube is coursing down the esophagus and into the stomach. Left IJ central venous catheter in good position, unchanged. The cardiac silhouette, mediastinal and hilar contours are within normal limits and stable. Persistent left lower lobe infiltrate and small effusion. The right lung remains clear. IMPRESSION: 1. Support apparatus in good position, unchanged. 2. Stable left lower lobe infiltrate and left effusion. Electronically Signed   By: Rudie Meyer M.D.   On: 03/07/2020 08:08   DG Chest Port 1 View  Result Date: 03/05/2020 CLINICAL DATA:  Endotracheal tube and central line  placement. EXAM: PORTABLE CHEST 1 VIEW COMPARISON:  Radiograph earlier this day. Lung bases from abdominal CT earlier this day. FINDINGS: Enteric tube tip 4.4 cm from the carina. Enteric tube in place with tip and side-port below the diaphragm. Left internal jugular central venous catheter tip projects over the upper SVC. Dense retrocardiac opacity, as seen on CT earlier today. Minimal patchy opacities in the right infrahilar lung. Unchanged heart size and mediastinal contours. No pneumothorax. IMPRESSION: 1. Endotracheal tube tip 4.4 cm from the carina. Enteric tube and left internal jugular central venous catheter in place. 2. Left retrocardiac opacity consistent with pneumonia, as seen on CT earlier today. Electronically Signed   By: Narda Rutherford M.D.   On: 03/05/2020 16:22   DG Chest Port 1 View  Result Date: 03/05/2020 CLINICAL DATA:  Check central line placement EXAM: PORTABLE CHEST 1 VIEW COMPARISON:  Film from earlier in the same day. FINDINGS: Endotracheal tube and gastric catheter are again seen and stable. Left jugular central line is noted with the catheter tip in the mid superior vena cava. No pneumothorax is seen. The lungs are well aerated with persistent left basilar opacity. No bony abnormality is seen. IMPRESSION: No pneumothorax following central line placement on the left. Electronically Signed   By: Alcide Clever M.D.   On: 03/05/2020 03:35   DG Chest  Port 1 View  Result Date: 03/05/2020 CLINICAL DATA:  Hypoxia EXAM: PORTABLE CHEST 1 VIEW COMPARISON:  03/02/2020 FINDINGS: Cardiac shadow is within normal limits. Gastric catheter is noted within the stomach although the proximal side port is noted in the distal esophagus. This could be advanced several cm further into the stomach. Endotracheal tube is noted in satisfactory position 5.8 cm above the carina. Lungs are well aerated bilaterally. Slight increase in left basilar infiltrative opacity is seen. IMPRESSION: Tubes and lines as  described above. The gastric catheter could be advanced further into the stomach. New left basilar infiltrative opacity. Electronically Signed   By: Alcide Clever M.D.   On: 03/05/2020 02:15   DG Abd Portable 2V  Result Date: 03/05/2020 CLINICAL DATA:  Concern for radiopaque foreign body. Patient reportedly consuming non-food objects EXAM: PORTABLE ABDOMEN - 2 VIEW COMPARISON:  March 05, 2013 study obtained earlier in the day FINDINGS: Supine and erect images obtained. Nasogastric tube tip and side port in body of stomach. There are multiple linear radiopaque foreign bodies scattered throughout the abdomen and upper pelvis as well as a slightly thicker metallic foreign body measuring 2.2 cm, located in the left mid abdomen. There is no bowel dilatation or air-fluid level to suggest bowel obstruction. No free air noted. Lung bases clear. Postoperative change noted in right mid abdomen. IMPRESSION: Multiple radiopaque foreign bodies present, likely either within small bowel or transverse colon. Suspected portion of needle in left mid abdomen, likely in jejunum. No bowel obstruction or free air evident. Nasogastric tube tip and side port in stomach. Lung bases clear. Electronically Signed   By: Bretta Bang III M.D.   On: 03/05/2020 08:40   Overnight EEG with video  Result Date: 03/06/2020 Charlsie Quest, MD     03/07/2020  9:18 AM Patient Name: Jared Kramer MRN: [id number] Epilepsy Attending: Charlsie Quest Referring Physician/Provider: Dr. Caryl Pina Duration: 03/05/2020 1630 to 03/06/2020 1630 Patient history: 39 year old male with new onset generalized tonic-clonic seizures in the setting of drug overdose (UDS positive for cocaine and tricyclic antidepressant).  EEG to evaluate for seizures. Level of alertness: Comatose/sedated AEDs during EEG study: Keppra, Versed Technical aspects: This EEG study was done with scalp electrodes positioned according to the 10-20 International system of electrode  placement. Electrical activity was acquired at a sampling rate of 500Hz  and reviewed with a high frequency filter of 70Hz  and a low frequency filter of 1Hz . EEG data were recorded continuously and digitally stored. DESCRIPTION:  EEG showed continuous generalized 2-3hz  delta slowing with overriding 15 to 18 Hz beta activity distributed symmetrically and diffusely. Hyperventilation and photic stimulation were not performed. Event button was pressed on 03/05/2020 at 1856 for whole body seizure like activity. Concomitant EEG before, during and after the event didn't show any eeg change to suggest seizure. ABNORMALITY - Continuous slow, generalized - Excessive beta, generalized  IMPRESSION: This study is suggestive of severe diffuse encephalopathy, non specific to etiology but most likely secondary to sedation. No seizures or epileptiform discharges were seen throughout the recording. Event button was pressed on 03/05/2020 at 1856 for whole body seizure like activity without concomitant eeg change and therefore this was NOT an epileptic event. Charlsie Quest   ECHOCARDIOGRAM COMPLETE  Result Date: 03/03/2020    ECHOCARDIOGRAM REPORT   Patient Name:   DIONDRE PULIS Date of Exam: 03/03/2020 Medical Rec #:  [id number]        Height:       70.0  in Accession #:    [id number]       Weight:       189.4 lb Date of Birth:  [birth year]        BSA:          2.039 m Patient Age:    39 years         BP:           80/56 mmHg Patient Gender: M                HR:           58 bpm. Exam Location:  [facility] Procedure: 2D Echo, Intracardiac Opacification Agent, Cardiac Doppler and Color            Doppler Indications:     ABN ECG 794.31  History:         Patient has no prior history of Echocardiogram examinations.  Sonographer:     Neysa Bonitohristy Roar Referring Phys:  [id number] Judithe ModestJEREMIAH D KEENE Diagnosing Phys: Julien Nordmannimothy Gollan MD IMPRESSIONS  1. Left ventricular ejection fraction, by estimation, is 60 to 65%. The left ventricle has normal function. The  left ventricle has no regional wall motion abnormalities. There is moderate left ventricular hypertrophy. Left ventricular diastolic parameters were normal.  2. Right ventricular systolic function was not well visualized. The right ventricular size is normal. There is mildly elevated pulmonary artery systolic pressure. The estimated right ventricular systolic pressure is 34.9 mmHg.  3. The inferior vena cava is dilated in size with <50% respiratory variability, suggesting right atrial pressure of 15 mmHg. FINDINGS  Left Ventricle: Left ventricular ejection fraction, by estimation, is 60 to 65%. The left ventricle has normal function. The left ventricle has no regional wall motion abnormalities. The left ventricular internal cavity size was normal in size. There is  moderate left ventricular hypertrophy. Left ventricular diastolic parameters were normal. Right Ventricle: The right ventricular size is normal. No increase in right ventricular wall thickness. Right ventricular systolic function was not well visualized. There is mildly elevated pulmonary artery systolic pressure. The tricuspid regurgitant velocity is 2.23 m/s, and with an assumed right atrial pressure of 15 mmHg, the estimated right ventricular systolic pressure is 34.9 mmHg. Left Atrium: Left atrial size was normal in size. Right Atrium: Right atrial size was normal in size. Pericardium: There is no evidence of pericardial effusion. Mitral Valve: The mitral valve is normal in structure. Normal mobility of the mitral valve leaflets. No evidence of mitral valve regurgitation. No evidence of mitral valve stenosis. Tricuspid Valve: The tricuspid valve is normal in structure. Tricuspid valve regurgitation is mild . No evidence of tricuspid stenosis. Aortic Valve: The aortic valve is normal in structure. Aortic valve regurgitation is not visualized. No aortic stenosis is present. Aortic valve mean gradient measures 2.0 mmHg. Aortic valve peak gradient  measures 2.7 mmHg. Aortic valve area, by VTI measures 2.85 cm. Pulmonic Valve: The pulmonic valve was normal in structure. Pulmonic valve regurgitation is not visualized. No evidence of pulmonic stenosis. Aorta: The aortic root is normal in size and structure. Venous: The inferior vena cava is dilated in size with less than 50% respiratory variability, suggesting right atrial pressure of 15 mmHg. IAS/Shunts: No atrial level shunt detected by color flow Doppler.  LEFT VENTRICLE PLAX 2D LVIDd:         4.32 cm  Diastology LVIDs:         3.20 cm  LV e' lateral:   6.53 cm/s LV PW:  1.35 cm  LV E/e' lateral: 8.1 LV IVS:        1.46 cm  LV e' medial:    8.27 cm/s LVOT diam:     2.00 cm  LV E/e' medial:  6.4 LV SV:         39 LV SV Index:   19 LVOT Area:     3.14 cm  RIGHT VENTRICLE RV Mid diam:    2.72 cm RV S prime:     7.40 cm/s LEFT ATRIUM           Index       RIGHT ATRIUM          Index LA diam:      3.70 cm 1.81 cm/m  RA Area:     9.20 cm LA Vol (A4C): 28.3 ml 13.88 ml/m RA Volume:   17.10 ml 8.38 ml/m  AORTIC VALVE                   PULMONIC VALVE AV Area (Vmax):    2.91 cm    PV Vmax:        0.68 m/s AV Area (Vmean):   3.07 cm    PV Peak grad:   1.8 mmHg AV Area (VTI):     2.85 cm    RVOT Peak grad: 1 mmHg AV Vmax:           82.70 cm/s AV Vmean:          58.000 cm/s AV VTI:            0.138 m AV Peak Grad:      2.7 mmHg AV Mean Grad:      2.0 mmHg LVOT Vmax:         76.60 cm/s LVOT Vmean:        56.700 cm/s LVOT VTI:          0.125 m LVOT/AV VTI ratio: 0.91  AORTA Ao Root diam: 2.60 cm MITRAL VALVE               TRICUSPID VALVE MV Area (PHT): 3.42 cm    TR Peak grad:   19.9 mmHg MV Decel Time: 222 msec    TR Vmax:        223.00 cm/s MV E velocity: 52.80 cm/s MV A velocity: 28.10 cm/s  SHUNTS MV E/A ratio:  1.88        Systemic VTI:  0.12 m                            Systemic Diam: 2.00 cm Ida Rogue MD Electronically signed by Ida Rogue MD Signature Date/Time: 03/03/2020/5:10:30 PM    Final        Subjective: I interviewed patient along with RN in room earlier this morning.  He had no specific complaints at that time.  No suicidal or homicidal ideations expressed.  However shortly thereafter, RN advised me that patient was threatening to leave AMA.  I proceeded to go back to the patient's room and he stated that he was not going to wait any longer, he was going to leave and did not need any prescriptions because he had his medications.  I advised him that there were some new medications which he obviously did not heed.  Discharge Exam:  Vitals:   03/11/20 0344 03/11/20 0430 03/11/20 0831 03/11/20 1120  BP: (!) 151/87  (!) 143/90 (!) 142/90  Pulse: 88  97 (!) 118  Resp: 20  18 18   Temp: 98.7 F (37.1 C)  98.2 F (36.8 C) 98.2 F (36.8 C)  TempSrc: Oral  Oral   SpO2: 99%  96% 97%  Weight:  89 kg      General exam: Pleasant young male, well built and nourished sitting up comfortably in bed without distress. Respiratory system: Scattered few basal crackles but otherwise clear to auscultation. Respiratory effort normal. Cardiovascular system: S1 & S2 heard, RRR. No JVD, murmurs, rubs, gallops or clicks. No pedal edema.    Telemetry personally reviewed: Mild sinus tachycardia in the 100s.. Gastrointestinal system: Abdomen is nondistended, soft and nontender. No organomegaly or masses felt. Normal bowel sounds heard.  Epigastric midline scar healed with secondary healing. Central nervous system: Alert and oriented. No focal neurological deficits. Extremities: Symmetric 5 x 5 power. Skin: No rashes, lesions or ulcers.  Extensive tattoos all over his body including face. Psychiatry: Judgement and insight appear impaired. Mood & affect slightly anxious.    The results of significant diagnostics from this hospitalization (including imaging, microbiology, ancillary and laboratory) are listed below for reference.     Microbiology: Recent Results (from the past 240 hour(s))   SARS CORONAVIRUS 2 (TAT 6-24 HRS) Nasopharyngeal Nasopharyngeal Swab     Status: None   Collection Time: 03/02/20 10:55 PM   Specimen: Nasopharyngeal Swab  Result Value Ref Range Status   SARS Coronavirus 2 NEGATIVE NEGATIVE Final    Comment: (NOTE) SARS-CoV-2 target nucleic acids are NOT DETECTED. The SARS-CoV-2 RNA is generally detectable in upper and lower respiratory specimens during the acute phase of infection. Negative results do not preclude SARS-CoV-2 infection, do not rule out co-infections with other pathogens, and should not be used as the sole basis for treatment or other patient management decisions. Negative results must be combined with clinical observations, patient history, and epidemiological information. The expected result is Negative. Fact Sheet for Patients: 05/02/20 Fact Sheet for Healthcare Providers: HairSlick.no This test is not yet approved or cleared by the [employer] FDA and  has been authorized for detection and/or diagnosis of SARS-CoV-2 by FDA under an Emergency Use Authorization (EUA). This EUA will remain  in effect (meaning this test can be used) for the duration of the COVID-19 declaration under Section 56 4(b)(1) of the Act, 21 U.S.C. section 360bbb-3(b)(1), unless the authorization is terminated or revoked sooner. Performed at [facility], [address], [place], [place]   Respiratory Panel by RT PCR (Flu A&B, Covid) - Nasopharyngeal Swab     Status: None   Collection Time: 03/03/20  3:15 AM   Specimen: Nasopharyngeal Swab  Result Value Ref Range Status   SARS Coronavirus 2 by RT PCR NEGATIVE NEGATIVE Final    Comment: (NOTE) SARS-CoV-2 target nucleic acids are NOT DETECTED. The SARS-CoV-2 RNA is generally detectable in upper respiratoy specimens during the acute phase of infection. The lowest concentration of SARS-CoV-2 viral copies this assay can detect  is 131 copies/mL. A negative result does not preclude SARS-Cov-2 infection and should not be used as the sole basis for treatment or other patient management decisions. A negative result may occur with  improper specimen collection/handling, submission of specimen other than nasopharyngeal swab, presence of viral mutation(s) within the areas targeted by this assay, and inadequate number of viral copies (<131 copies/mL). A negative result must be combined with clinical observations, patient history, and epidemiological information. The expected result is Negative. Fact Sheet for Patients:  05/03/20  Fact Sheet for Healthcare Providers:  https://www.young.biz/ This test is not yet ap proved or cleared by the [place] FDA and  has been authorized for detection and/or diagnosis of SARS-CoV-2 by FDA under an Emergency Use Authorization (EUA). This EUA will remain  in effect (meaning this test can be used) for the duration of the COVID-19 declaration under Section 564(b)(1) of the Act, 21 U.S.C. section 360bbb-3(b)(1), unless the authorization is terminated or revoked sooner.    Influenza A by PCR NEGATIVE NEGATIVE Final   Influenza B by PCR NEGATIVE NEGATIVE Final    Comment: (NOTE) The Xpert Xpress SARS-CoV-2/FLU/RSV assay is intended as an aid in  the diagnosis of influenza from Nasopharyngeal swab specimens and  should not be used as a sole basis for treatment. Nasal washings and  aspirates are unacceptable for Xpert Xpress SARS-CoV-2/FLU/RSV  testing. Fact Sheet for Patients: https://www.moore.com/ Fact Sheet for Healthcare Providers: https://www.young.biz/ This test is not yet approved or cleared by the [place] FDA and  has been authorized for detection and/or diagnosis of SARS-CoV-2 by  FDA under an Emergency Use Authorization (EUA). This EUA will remain  in effect (meaning this  test can be used) for the duration of the  Covid-19 declaration under Section 564(b)(1) of the Act, 21  U.S.C. section 360bbb-3(b)(1), unless the authorization is  terminated or revoked. Performed at [facility], [address] [place], [place] [address]   MRSA PCR Screening     Status: None   Collection Time: 03/03/20  5:08 AM   Specimen: Nasopharyngeal  Result Value Ref Range Status   MRSA by PCR NEGATIVE NEGATIVE Final    Comment:        The GeneXpert MRSA Assay (FDA approved for NASAL specimens only), is one component of a comprehensive MRSA colonization surveillance program. It is not intended to diagnose MRSA infection nor to guide or monitor treatment for MRSA infections. Performed at [facility], [address] [place], [place] [address]   Culture, respiratory (non-expectorated)     Status: None   Collection Time: 03/05/20  3:25 AM   Specimen: Tracheal Aspirate; Respiratory  Result Value Ref Range Status   Specimen Description   Final    TRACHEAL ASPIRATE Performed at [facility], [address] [place], [place] [address]    Special Requests   Final    NONE Performed at [facility], [address] [place], [place] [address]    Gram Stain   Final    ABUNDANT WBC PRESENT, PREDOMINANTLY PMN ABUNDANT GRAM POSITIVE RODS    Culture   Final    ABUNDANT DIPHTHEROIDS(CORYNEBACTERIUM SPECIES) Standardized susceptibility testing for this organism is not available. Performed at [facility], [address], [place], [place] [address]    Report Status 03/07/2020 FINAL  Final     Labs: CBC: Recent Labs  Lab 03/06/20 0724 03/06/20 0724 03/07/20 0509 03/07/20 0636 03/08/20 0526 03/09/20 0503 03/10/20 0404  WBC 12.8*  --  10.2  --  12.3* 9.6 8.1  HGB 10.9*   < > 10.7* 10.9* 12.0* 11.5* 11.1*  HCT 35.5*   < > 35.3* 32.0* 37.4* 36.3* 35.9*  MCV 89.9  --  92.2  --  88.2 88.5 88.6  PLT 113*  --  158  --  209 214  241   < > = values in this interval not displayed.    Basic Metabolic Panel: Recent Labs  Lab 03/05/20 0411 03/06/20 0724 03/06/20 1813 03/06/20  1813 03/07/20 0509 03/07/20 0636 03/07/20 1756 03/08/20 0526 03/09/20 0503 03/10/20 0404  NA   < > 144  --   --  141 142  --  138 136 137  K   < > 3.1*  --   --  4.1 4.1  --  3.3* 3.8 3.8  CL  --  116*  --   --  111  --   --  106 104 102  CO2  --  20*  --   --  22  --   --  19* 22 25  GLUCOSE  --  75  --   --  110*  --   --  109* 97 110*  BUN  --  9  --   --  14  --   --  CREATININE  --  1.03  --   --  1.22  --   --  0.84 0.76 0.93  CALCIUM  --  6.9*  --   --  7.8*  --   --  8.4* 7.8* 8.2*  MG   < >  --  1.8  --  1.8  --  1.7 1.6* 2.0  --   PHOS   < >  --  3.0   < > 2.9  --  4.4 3.1 2.3* 4.1   < > = values in this interval not displayed.    Liver Function Tests: No results for input(s): AST, ALT, ALKPHOS, BILITOT, PROT, ALBUMIN in the last 168 hours.  CBG: Recent Labs  Lab 03/10/20 2104 03/11/20 0604 03/11/20 0618 03/11/20 0741 03/11/20 1141  GLUCAP 161* 68* 98 90 84    Hgb A1c No results for input(s): HGBA1C in the last 72 hours.  Lipid Profile No results for input(s): CHOL, HDL, LDLCALC, TRIG, CHOLHDL, LDLDIRECT in the last 72 hours.  Thyroid function studies No results for input(s): TSH, T4TOTAL, T3FREE, THYROIDAB in the last 72 hours.  Invalid input(s): FREET3  Anemia work up No results for input(s): VITAMINB12, FOLATE, FERRITIN, TIBC, IRON, RETICCTPCT in the last 72 hours.  Urinalysis    Component Value Date/Time   COLORURINE STRAW (A) 03/02/2020 2255   APPEARANCEUR CLEAR (A) 03/02/2020 2255   APPEARANCEUR Clear 03/07/2015 0043   LABSPEC 1.005 03/02/2020 2255   LABSPEC 1.014 03/07/2015 0043   PHURINE 5.0 03/02/2020 2255   GLUCOSEU >=500 (A) 03/02/2020 2255   GLUCOSEU Negative 03/07/2015 0043   HGBUR NEGATIVE 03/02/2020 2255   BILIRUBINUR NEGATIVE 03/02/2020 2255   BILIRUBINUR Negative  03/07/2015 0043   KETONESUR NEGATIVE 03/02/2020 2255   PROTEINUR NEGATIVE 03/02/2020 2255   NITRITE NEGATIVE 03/02/2020 2255   LEUKOCYTESUR NEGATIVE 03/02/2020 2255   LEUKOCYTESUR Negative 03/07/2015 0043      Time coordinating discharge: 45 minutes  SIGNED:  Marcellus Scott, MD, FACP, [facility]. [facility]  To contact the attending provider between 7A-7P or the covering provider during after hours 7P-7A, please log into the web site [email or web address] and access using universal [place] password for that web site. If you do not have the password, please call the hospital operator.

## 2020-03-11 NOTE — Progress Notes (Addendum)
CBG 68, pt asymptomatic.Gave pt orange juice.   Recheck CBG: 98

## 2020-03-11 NOTE — Consult Note (Signed)
Discussed patient's medications with Dr. Waymon Amato. -From psychiatrist point of view, patient will benefit from Remeron to address depression/anxiety rather Wellbtrin XL which may decrease seizure threshold and makes anxiety worse-besides there is no evidence that patient was fully compliant with home medications. -Regarding Suboxone, review of PDMP revealed that patient last dose was 12/22/2019 and there is no evidence that he has been receiving it on consistent bases but there is evidence that he is still getting prescription for Oxycodone which should not be mixed with Suboxone.  Recommendations- -Patient was cleared by psychiatric service on 03/08/20 -Prefer patient to be on Remeron 15 mg at bedtime for depression/anxiety -Discontinue Wellbutrin XL-may decreased seizure threshold and makes anxiety worse. -Wean patient off Suboxone because he is still mixing it up with opioid which may be dangerous. Consider placing patient on Clonidine opioid withdrawal protocol if necessary.  Thedore Mins, MD Attending psychiatrist.

## 2021-03-27 IMAGING — CT CT HEAD W/O CM
3 series · 15 of 47 positions shown, 18 images · non-contrast
Comparison: 08/06/2019

CLINICAL DATA: Possible overdose

EXAM:
CT HEAD WITHOUT CONTRAST
TECHNIQUE: Contiguous axial images were obtained from the base of the skull
through the vertex without intravenous contrast.

[Series 2: head wo · axial · 0.46mm/px · z∈[-49,+81]mm · 9 of 32 slices shown, 12 images]
[im 3/32  brain]
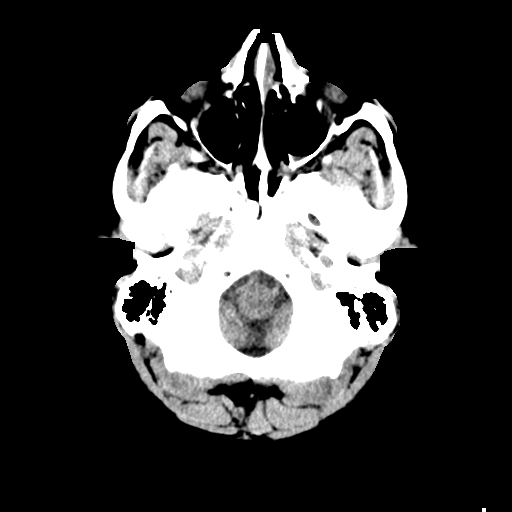
[im 3/32  bone]
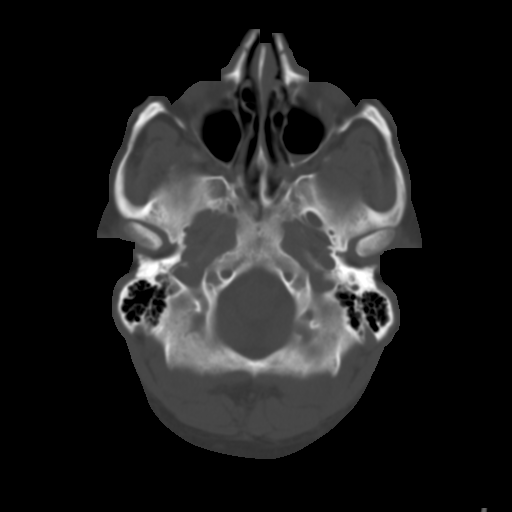
[im 6/32  brain]
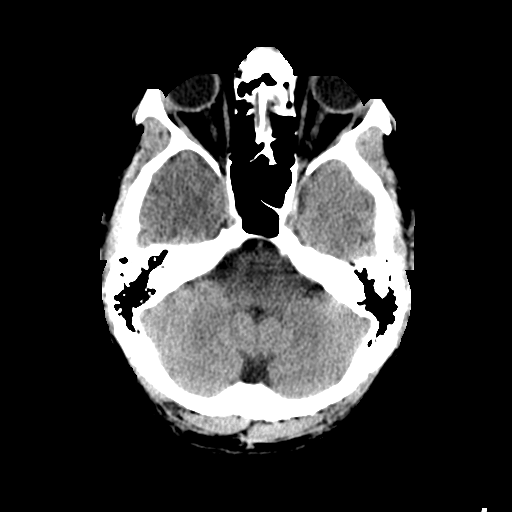
[im 9/32  brain]
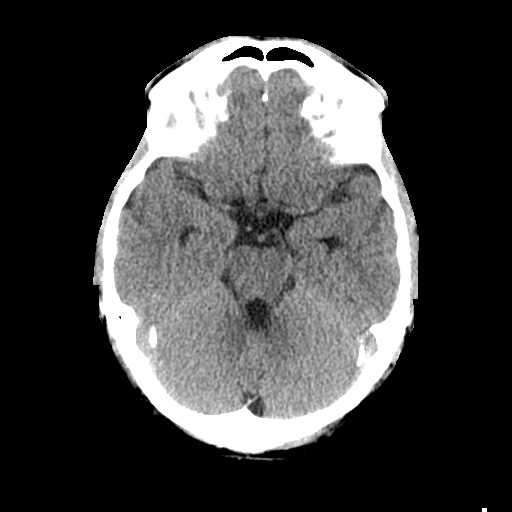
[im 12/32  brain]
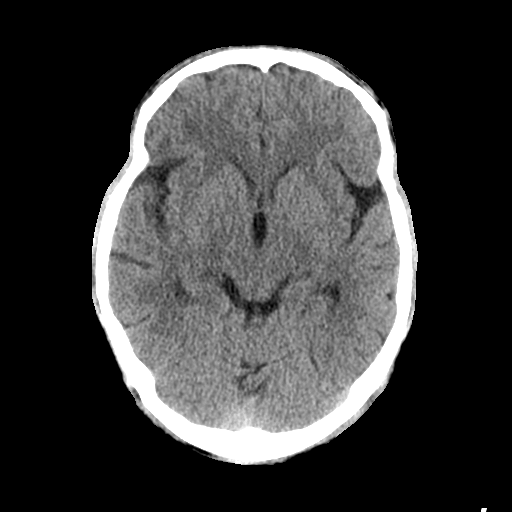
[im 17/32  brain]
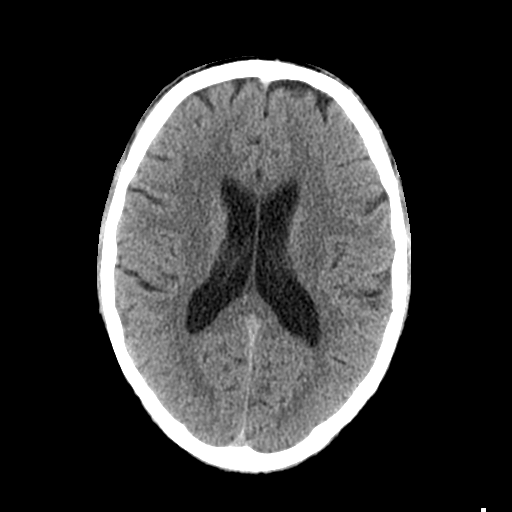
[im 17/32  bone]
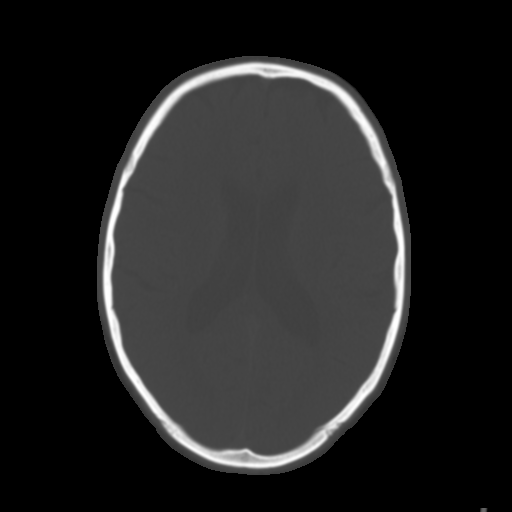
[im 20/32  brain]
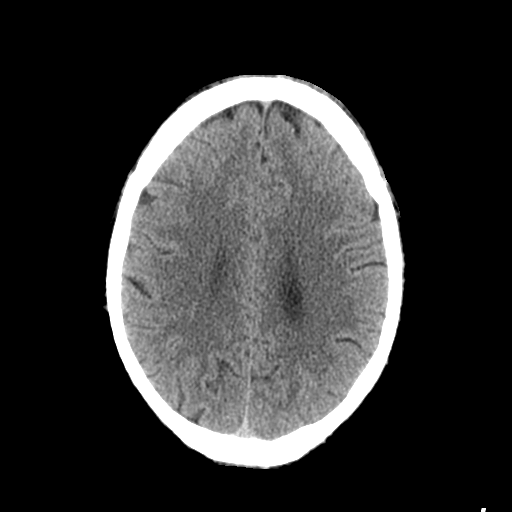
[im 23/32  brain]
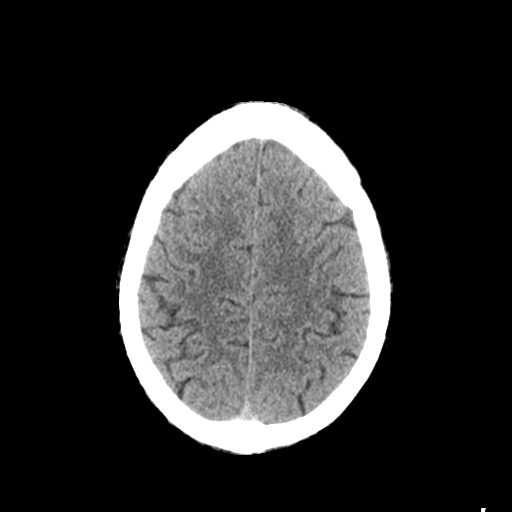
[im 26/32  brain]
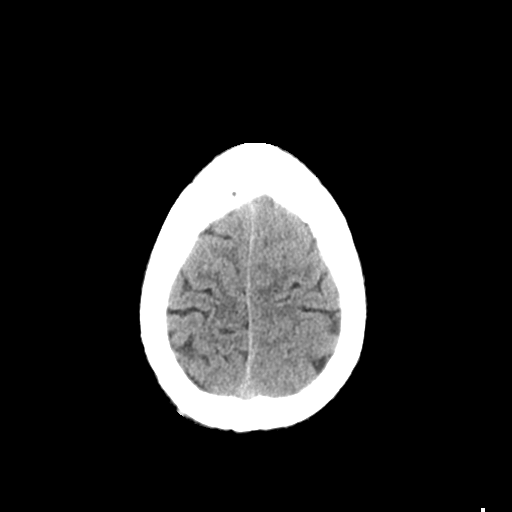
[im 29/32  brain]
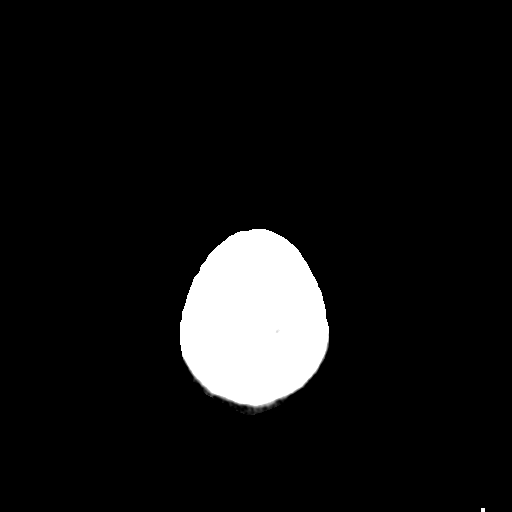
[im 29/32  bone]
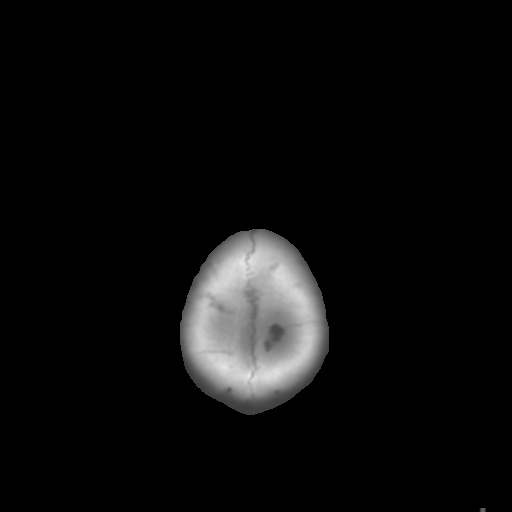

[Series 4: coronal soft tissue · coronal · 0.31mm/px · 3 of 66 slices shown]
[im 22/66  brain]
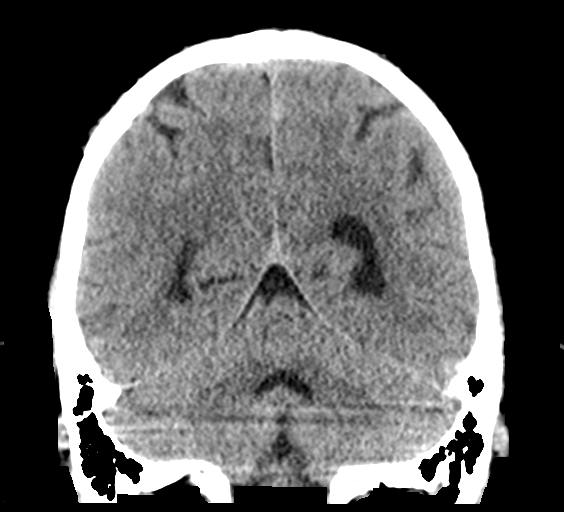
[im 29/66  brain]
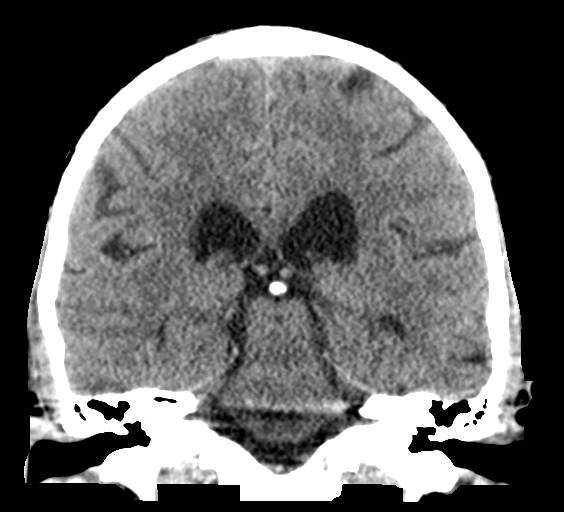
[im 37/66  brain]
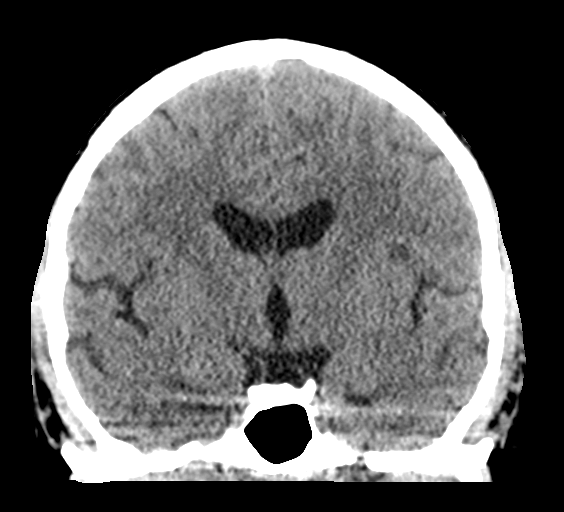

[Series 5: sagittal soft tissue · sagittal · 0.32mm/px · 3 of 54 slices shown]
[im 18/54  brain]
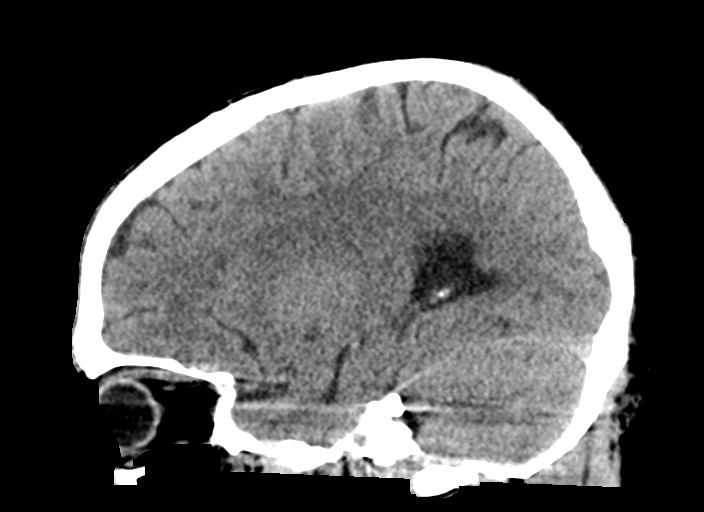
[im 27/54  brain]
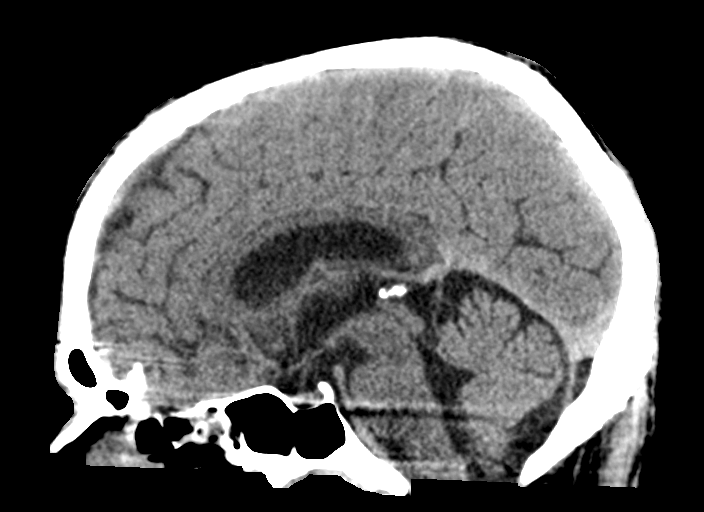
[im 36/54  brain]
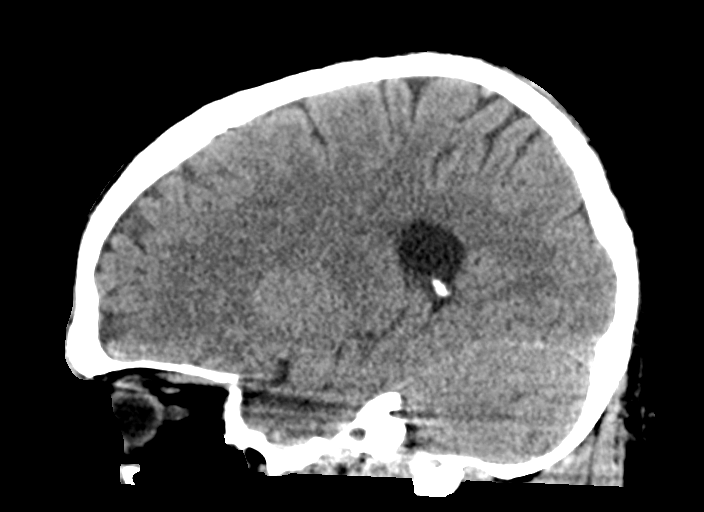

[15 of 47 positions shown; findings below may reference images not displayed]

FINDINGS: Brain: No evidence of acute infarction, hemorrhage, hydrocephalus,
extra-axial collection or mass lesion/mass effect.

Vascular: No hyperdense vessel or unexpected calcification.

Skull: Normal. Negative for fracture or focal lesion.

Sinuses/Orbits: Minimal air-fluid level is noted within the left
maxillary antrum.

Other: None
IMPRESSION: No acute intracranial abnormality noted.

Minimal air-fluid level within the left maxillary antrum.

## 2021-03-29 IMAGING — DX DG CHEST 1V PORT
1 series · 1 of 1 positions shown · non-contrast
Comparison: 03/02/2020

CLINICAL DATA: Hypoxia

EXAM:
PORTABLE CHEST 1 VIEW

[chest pa]
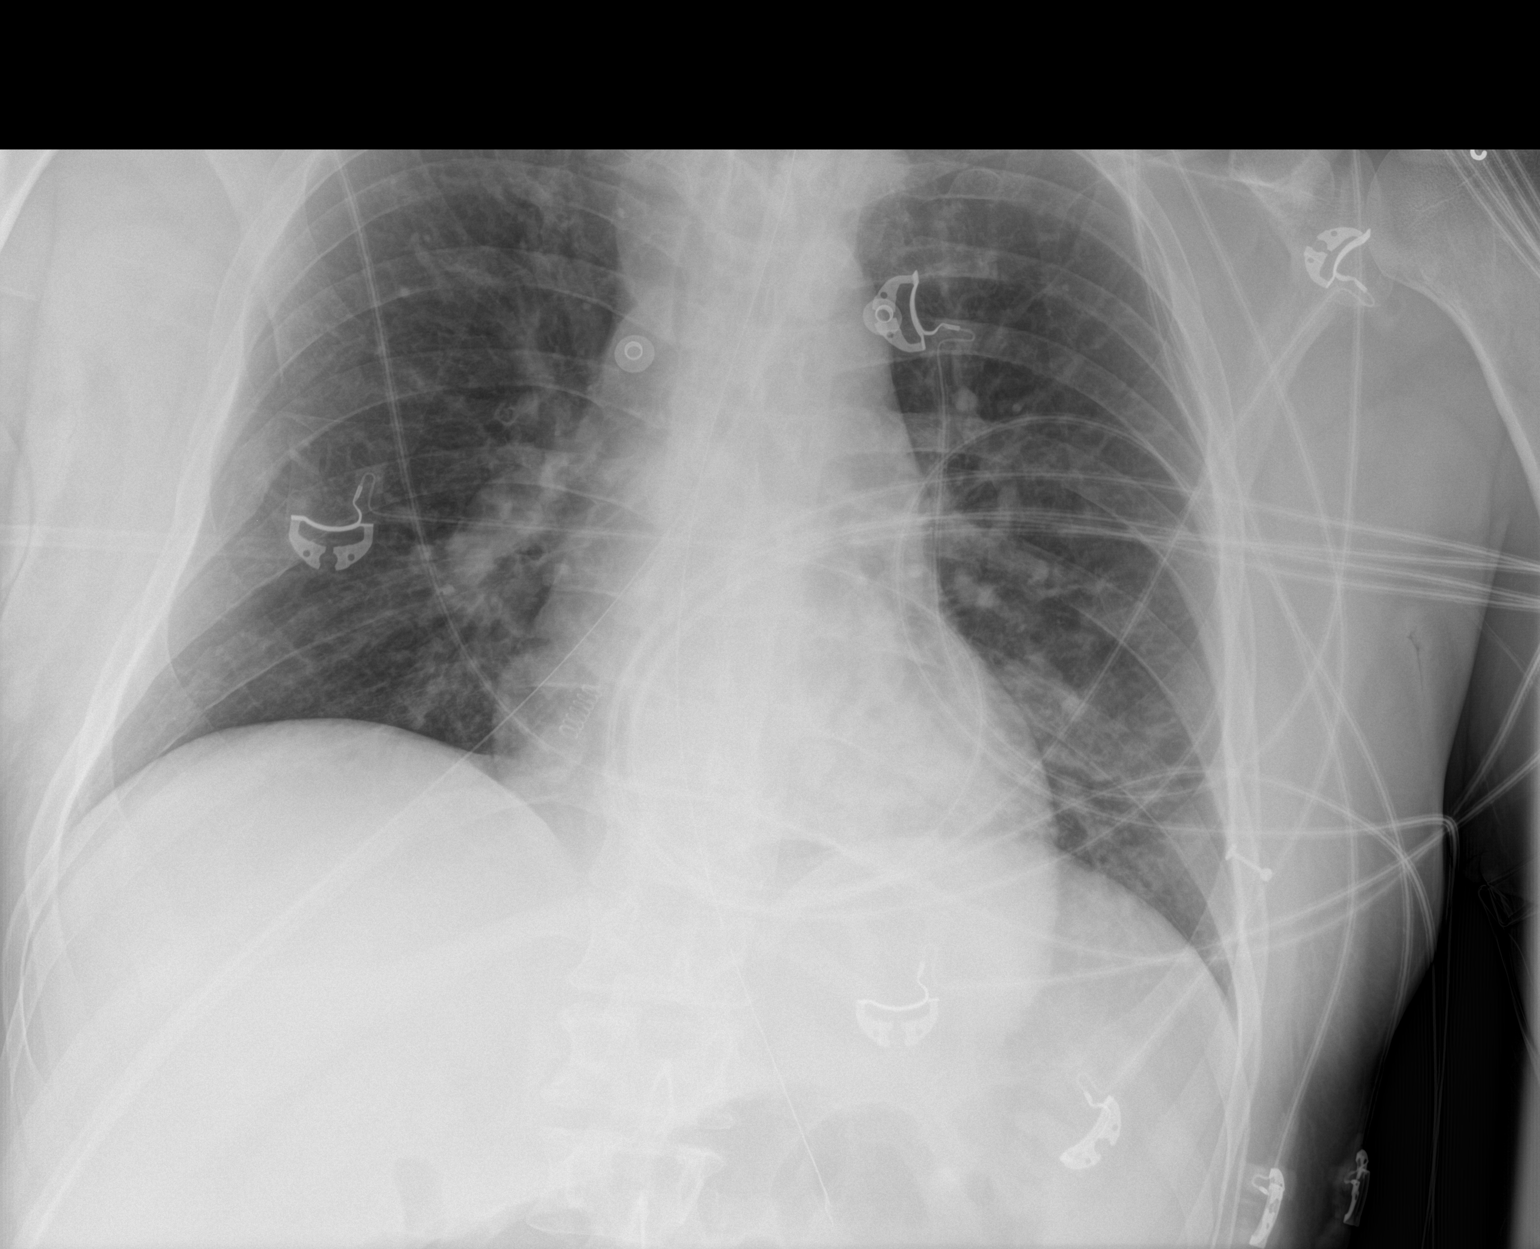

[1 of 1 positions shown; findings below may reference images not displayed]

FINDINGS: Cardiac shadow is within normal limits. Gastric catheter is noted
within the stomach although the proximal side port is noted in the
distal esophagus. This could be advanced several cm further into the
stomach. Endotracheal tube is noted in satisfactory position 5.8 cm
above the carina. Lungs are well aerated bilaterally. Slight
increase in left basilar infiltrative opacity is seen.
IMPRESSION: Tubes and lines as described above. The gastric catheter could be
advanced further into the stomach.

New left basilar infiltrative opacity.

## 2021-03-29 IMAGING — DX DG CHEST 1V PORT
1 series · 1 of 1 positions shown · non-contrast
Comparison: Radiograph earlier this day. Lung bases from abdominal
CT earlier this day.

CLINICAL DATA: Endotracheal tube and central line placement.

EXAM:
PORTABLE CHEST 1 VIEW

[chest ap]
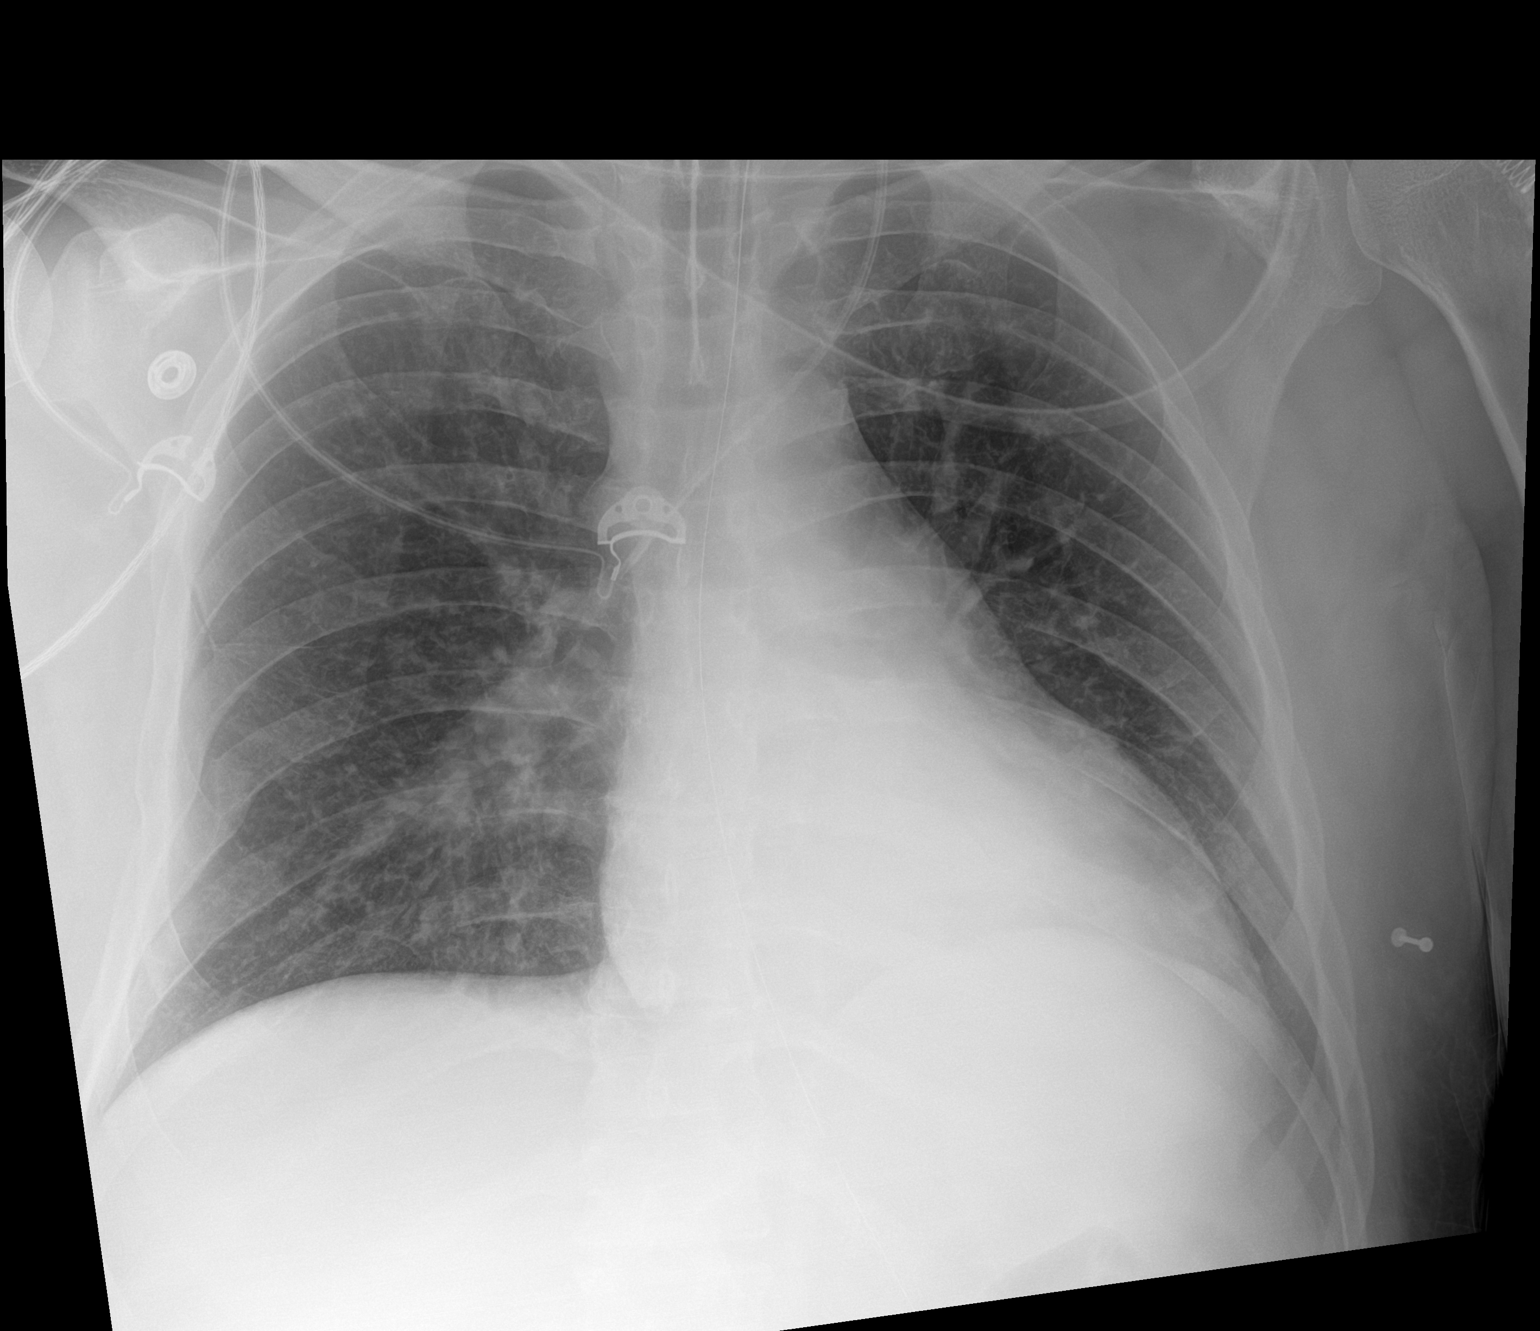

[1 of 1 positions shown; findings below may reference images not displayed]

FINDINGS: Enteric tube tip 4.4 cm from the carina. Enteric tube in place with
tip and side-port below the diaphragm. Left internal jugular central
venous catheter tip projects over the upper SVC. Dense retrocardiac
opacity, as seen on CT earlier today. Minimal patchy opacities in
the right infrahilar lung. Unchanged heart size and mediastinal
contours. No pneumothorax.
IMPRESSION: 1. Endotracheal tube tip 4.4 cm from the carina. Enteric tube and
left internal jugular central venous catheter in place.
2. Left retrocardiac opacity consistent with pneumonia, as seen on
CT earlier today.

## 2021-03-31 IMAGING — DX DG CHEST 1V PORT
1 series · 1 of 1 positions shown · non-contrast
Comparison: 03/05/2020

CLINICAL DATA: Acute respiratory failure.

EXAM:
PORTABLE CHEST 1 VIEW

[chest ap]
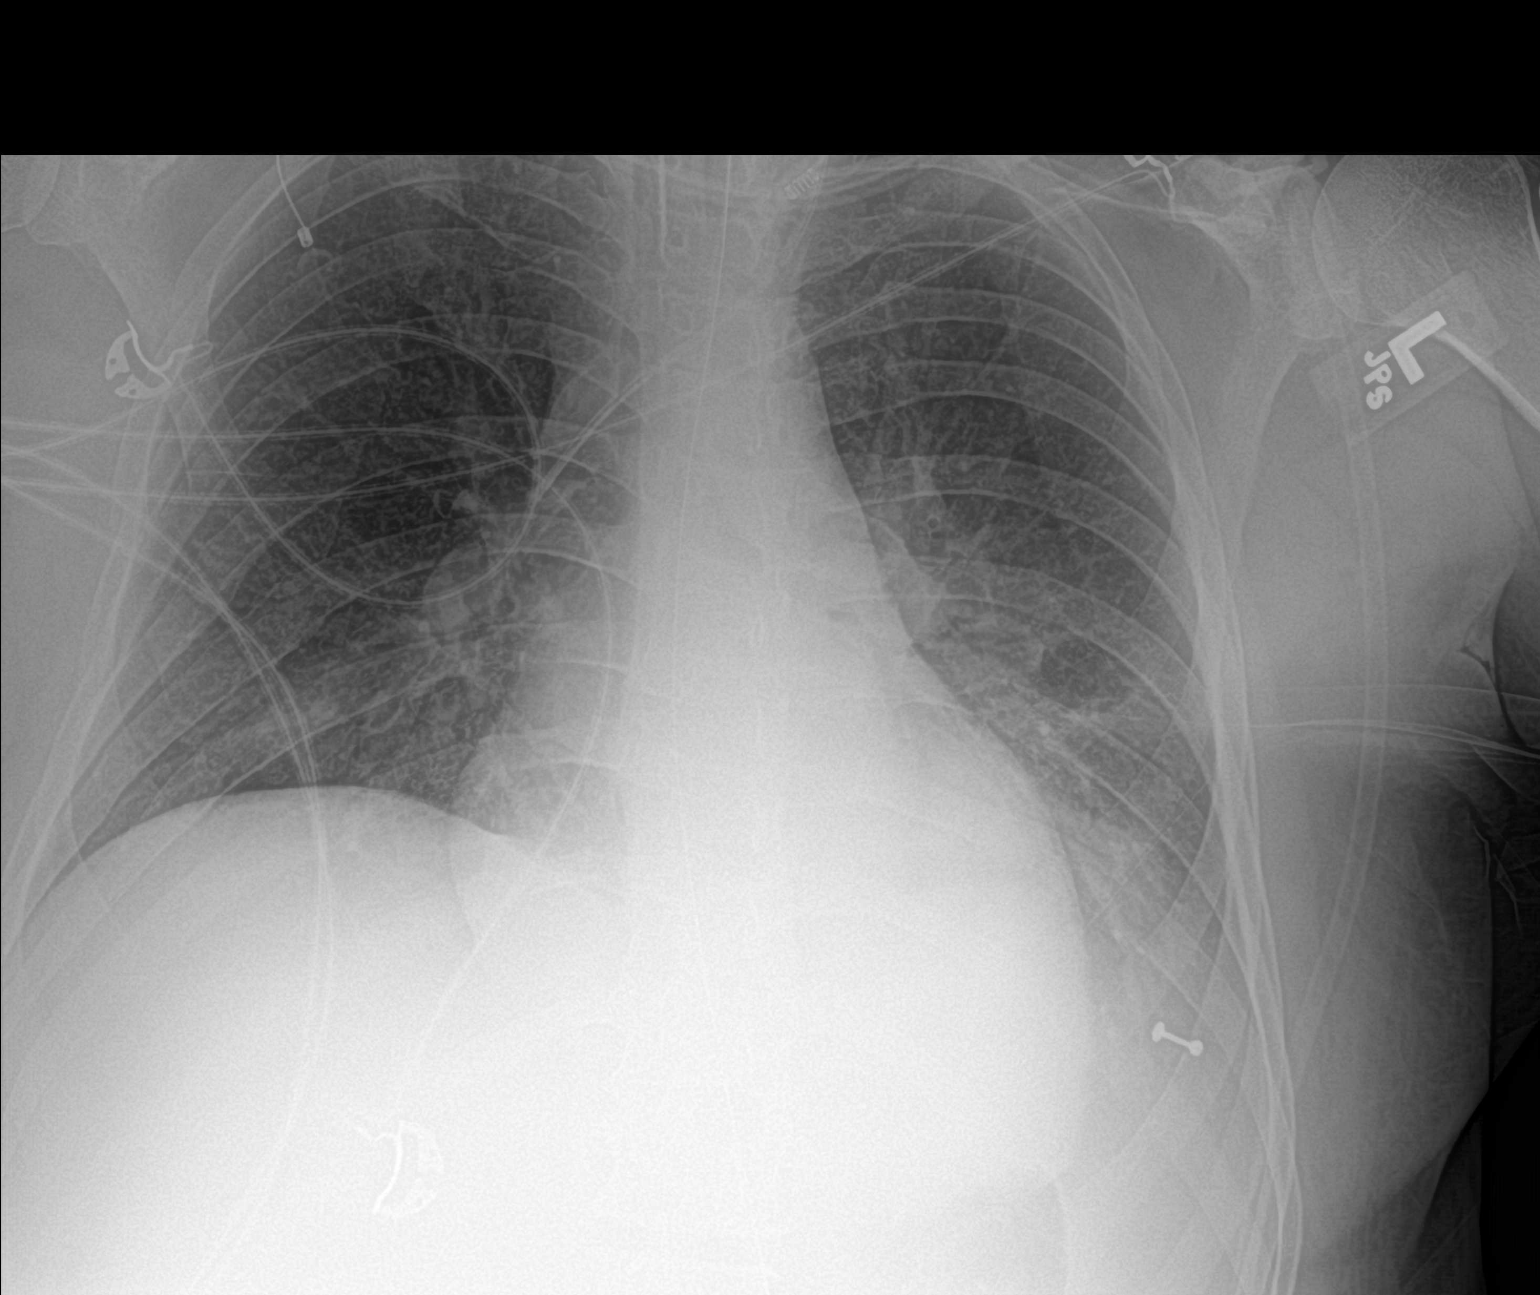

[1 of 1 positions shown; findings below may reference images not displayed]

FINDINGS: The endotracheal tube is 5 cm above the carina.

The NG tube is coursing down the esophagus and into the stomach.

Left IJ central venous catheter in good position, unchanged.

The cardiac silhouette, mediastinal and hilar contours are within
normal limits and stable. Persistent left lower lobe infiltrate and
small effusion. The right lung remains clear.
IMPRESSION: 1. Support apparatus in good position, unchanged.
2. Stable left lower lobe infiltrate and left effusion.
